# Patient Record
Sex: Female | Born: 1960 | Race: White | Hispanic: No | Marital: Married | Smoking: Never smoker
Health system: Southern US, Community
[De-identification: ages and names within clinical notes are randomized; demographics above are authoritative.]

## PROBLEM LIST (undated history)

## (undated) DIAGNOSIS — E079 Disorder of thyroid, unspecified: Secondary | ICD-10-CM

## (undated) DIAGNOSIS — G510 Bell's palsy: Secondary | ICD-10-CM

## (undated) DIAGNOSIS — R569 Unspecified convulsions: Secondary | ICD-10-CM

## (undated) DIAGNOSIS — I10 Essential (primary) hypertension: Secondary | ICD-10-CM

## (undated) DIAGNOSIS — G43909 Migraine, unspecified, not intractable, without status migrainosus: Secondary | ICD-10-CM

## (undated) DIAGNOSIS — F419 Anxiety disorder, unspecified: Secondary | ICD-10-CM

## (undated) DIAGNOSIS — H539 Unspecified visual disturbance: Secondary | ICD-10-CM

## (undated) DIAGNOSIS — G35 Multiple sclerosis: Secondary | ICD-10-CM

## (undated) HISTORY — PX: TUBAL LIGATION: SHX77

## (undated) HISTORY — DX: Essential (primary) hypertension: I10

## (undated) HISTORY — PX: ENDOMETRIAL ABLATION: SHX621

## (undated) HISTORY — DX: Unspecified convulsions: R56.9

## (undated) HISTORY — DX: Unspecified visual disturbance: H53.9

---

## 2008-01-09 ENCOUNTER — Emergency Department (HOSPITAL_BASED_OUTPATIENT_CLINIC_OR_DEPARTMENT_OTHER): Admission: EM | Admit: 2008-01-09 | Discharge: 2008-01-09 | Payer: Self-pay | Admitting: Emergency Medicine

## 2008-12-05 IMAGING — CT CT HEAD W/O CM
1 series · 16 of 30 positions shown, 20 images · non-contrast
Comparison: None

CLINICAL DATA: Sudden onset left sided facial numbness, drooping,
unable to speak, difficulty eating.  History migraine headaches.

CT HEAD WITHOUT CONTRAST
TECHNIQUE: Contiguous axial images were obtained from the base of
the skull through the vertex without contrast.

[Series 2: head 4.8 h37s · axial · 0.44mm/px · z∈[+1352,+1492]mm · 16 of 32 slices shown, 20 images]
[im 2/32  brain]
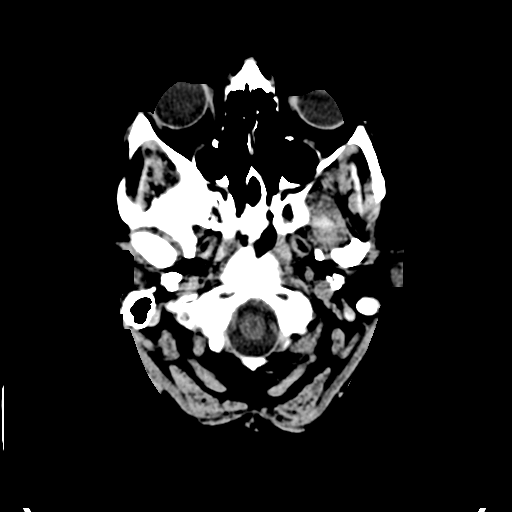
[im 2/32  bone]
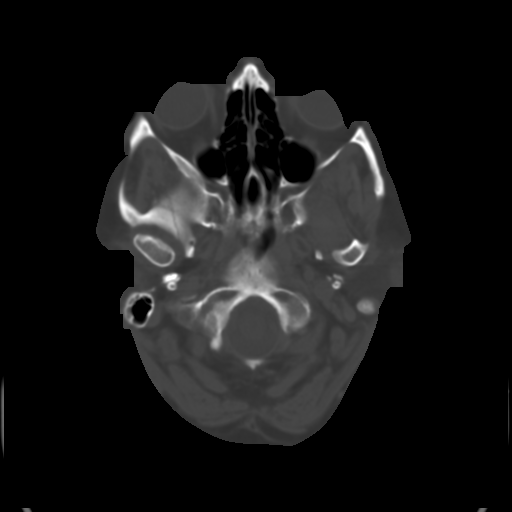
[im 4/32  brain]
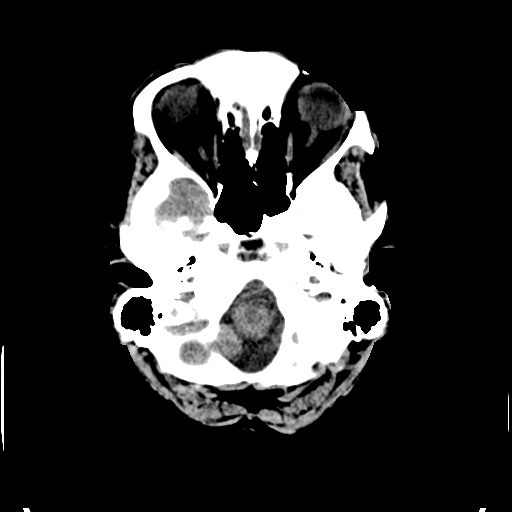
[im 6/32  brain]
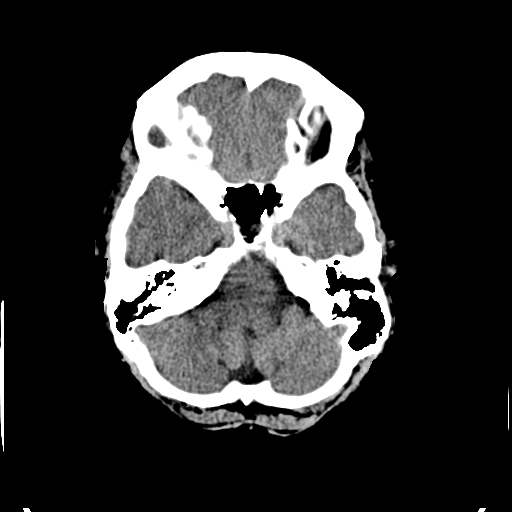
[im 8/32  brain]
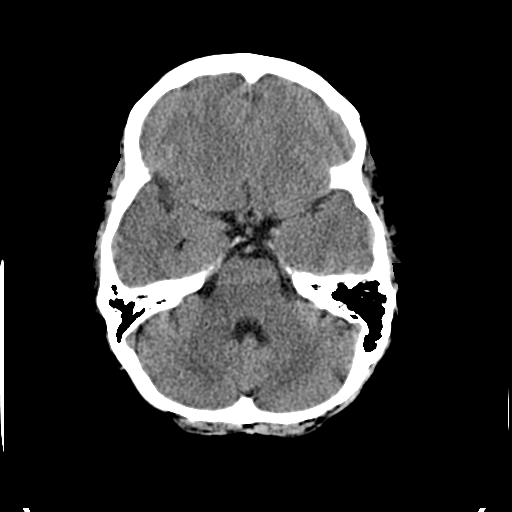
[im 9/32  brain]
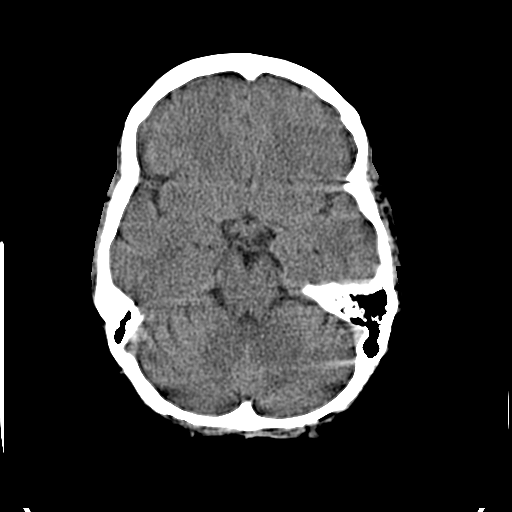
[im 9/32  bone]
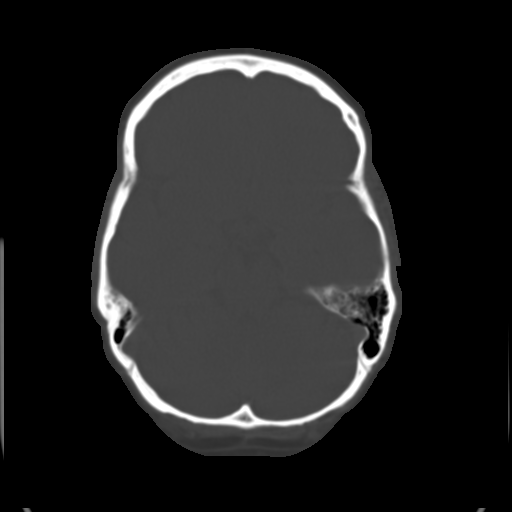
[im 11/32  brain]
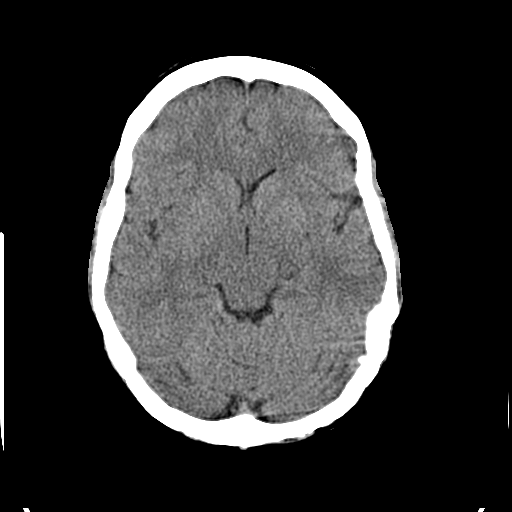
[im 13/32  brain]
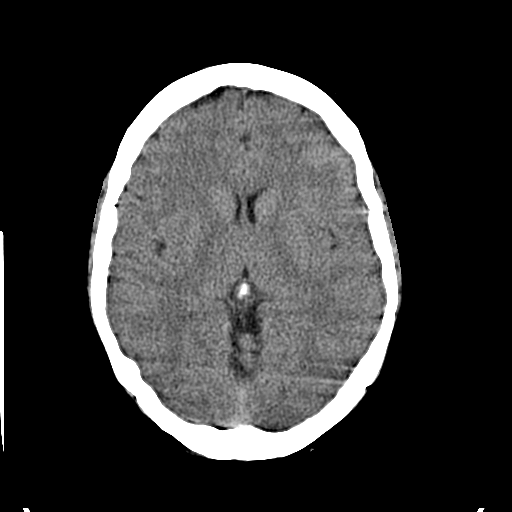
[im 15/32  brain]
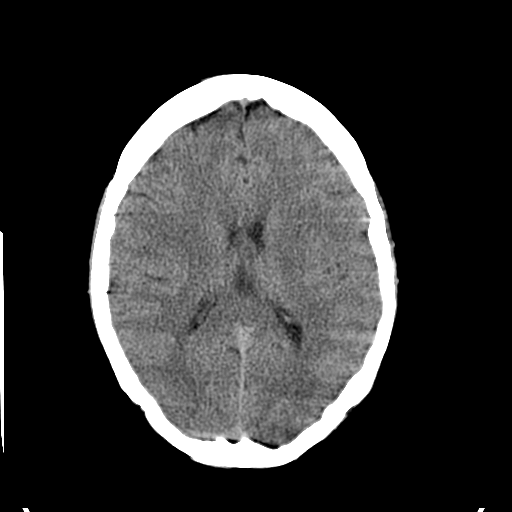
[im 17/32  brain]
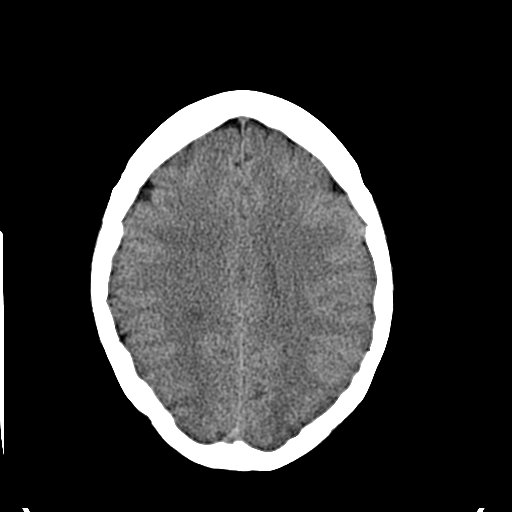
[im 17/32  bone]
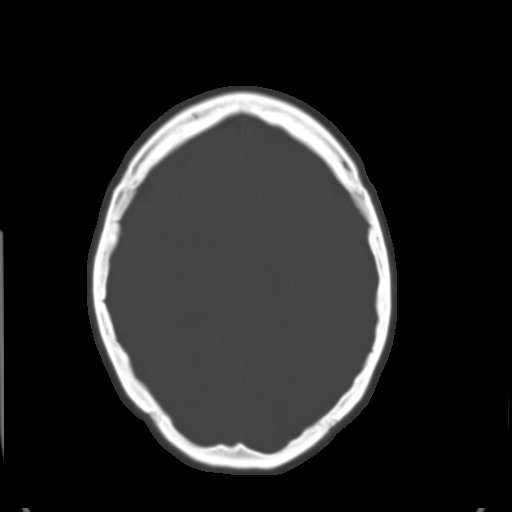
[im 19/32  brain]
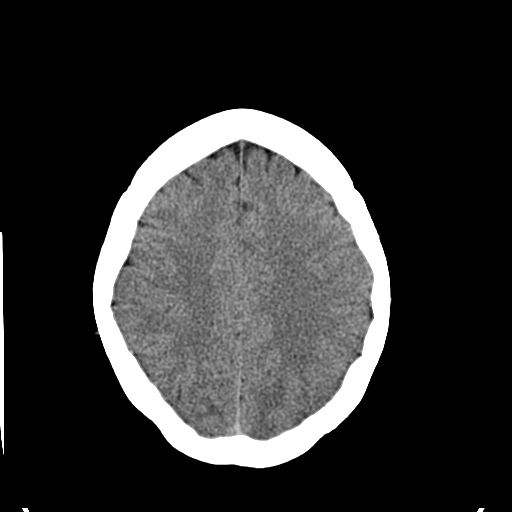
[im 21/32  brain]
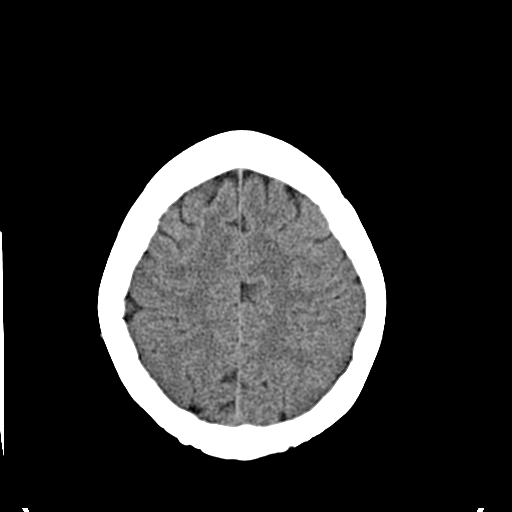
[im 23/32  brain]
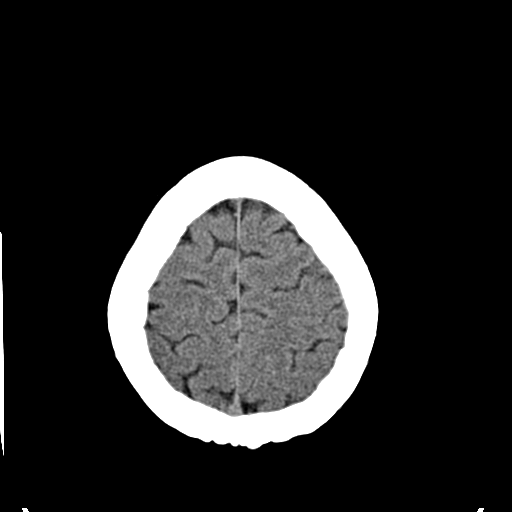
[im 24/32  brain]
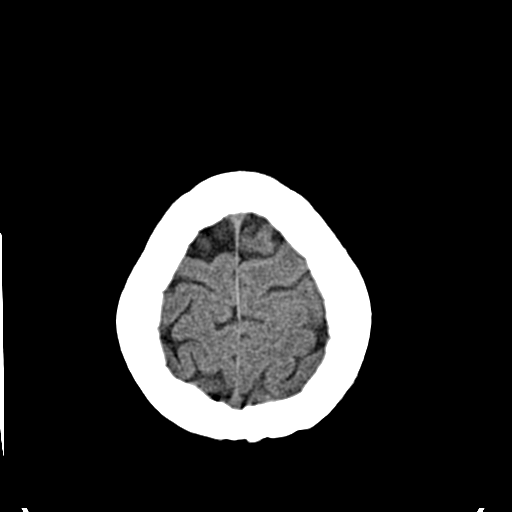
[im 24/32  bone]
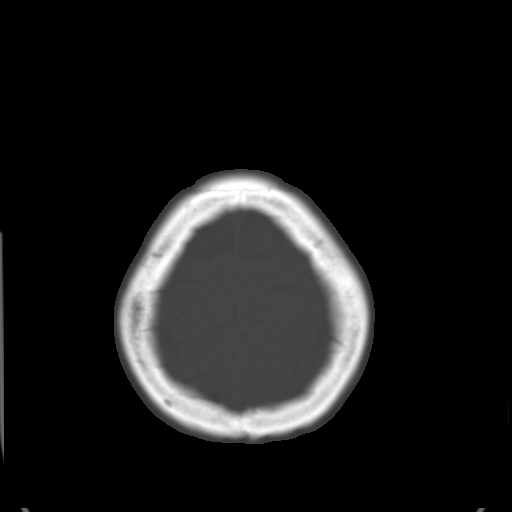
[im 26/32  brain]
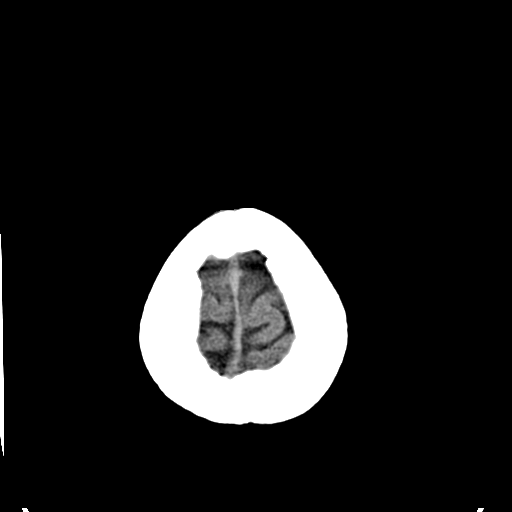
[im 28/32  brain]
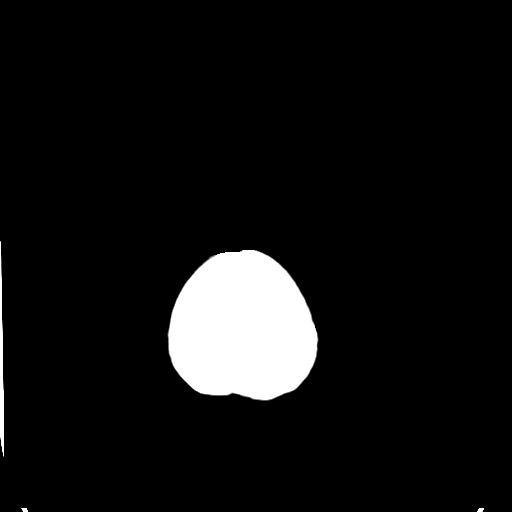
[im 30/32  brain]
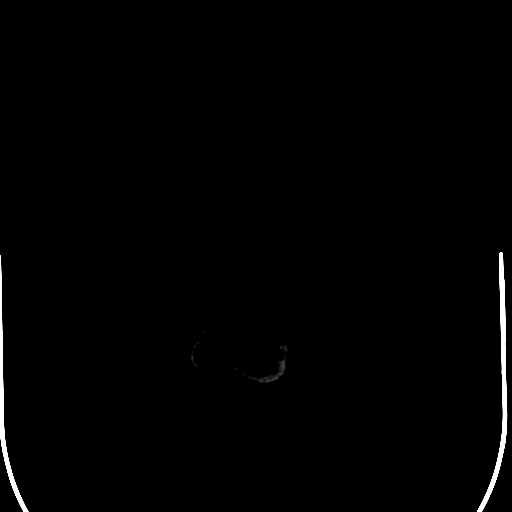

[16 of 30 positions shown; findings below may reference images not displayed]

FINDINGS: Cerebrum, cerebral ventricles, brainstem, and cerebellum
appear normal.  Specifically no focal intracerebral acute
hemorrhage, edema, tumor, mass effect/midline shift or extra-axial
hemorrhage/collection is seen.  Paranasal sinuses and bilateral
mastoid air cells are clear.
IMPRESSION: Normal.

## 2012-10-08 DIAGNOSIS — E669 Obesity, unspecified: Secondary | ICD-10-CM | POA: Insufficient documentation

## 2012-10-08 DIAGNOSIS — G35 Multiple sclerosis: Secondary | ICD-10-CM | POA: Insufficient documentation

## 2014-10-24 ENCOUNTER — Encounter: Payer: Self-pay | Admitting: Emergency Medicine

## 2014-10-24 ENCOUNTER — Emergency Department (INDEPENDENT_AMBULATORY_CARE_PROVIDER_SITE_OTHER)
Admission: EM | Admit: 2014-10-24 | Discharge: 2014-10-24 | Disposition: A | Discharge status: Home / Self Care | Payer: BLUE CROSS/BLUE SHIELD | Source: Home / Self Care | Attending: Emergency Medicine | Admitting: Emergency Medicine

## 2014-10-24 ENCOUNTER — Emergency Department (INDEPENDENT_AMBULATORY_CARE_PROVIDER_SITE_OTHER): Payer: BLUE CROSS/BLUE SHIELD

## 2014-10-24 DIAGNOSIS — R1011 Right upper quadrant pain: Secondary | ICD-10-CM

## 2014-10-24 DIAGNOSIS — K5900 Constipation, unspecified: Secondary | ICD-10-CM

## 2014-10-24 DIAGNOSIS — R000 Tachycardia, unspecified: Secondary | ICD-10-CM

## 2014-10-24 HISTORY — DX: Bell's palsy: G51.0

## 2014-10-24 HISTORY — DX: Anxiety disorder, unspecified: F41.9

## 2014-10-24 HISTORY — DX: Migraine, unspecified, not intractable, without status migrainosus: G43.909

## 2014-10-24 HISTORY — DX: Disorder of thyroid, unspecified: E07.9

## 2014-10-24 HISTORY — DX: Multiple sclerosis: G35

## 2014-10-24 LAB — POCT URINALYSIS DIP (MANUAL ENTRY)
Bilirubin, UA: NEGATIVE
GLUCOSE UA: NEGATIVE
Ketones, POC UA: NEGATIVE
LEUKOCYTES UA: NEGATIVE
Nitrite, UA: NEGATIVE
PH UA: 6 (ref 5–8)
PROTEIN UA: NEGATIVE
Spec Grav, UA: <=1.005 (ref 1.005–1.03)
Urobilinogen, UA: 0.2 (ref 0–1)

## 2014-10-24 LAB — POCT FASTING CBG KUC MANUAL ENTRY: POCT GLUCOSE (MANUAL ENTRY) KUC: 124 mg/dL — AB (ref 70–99)

## 2014-10-24 MED ORDER — POLYETHYLENE GLYCOL 3350 17 G PO PACK: 17.0000 g | PACK | Freq: Every day | ORAL | Status: DC

## 2014-10-24 NOTE — ED Provider Notes (Signed)
CSN: [id number]     Arrival date & time 10/24/14  1101 History   First MD Initiated Contact with Patient 10/24/14 1138     Chief Complaint  Patient presents with  . Urinary Frequency   (Consider location/radiation/quality/duration/timing/severity/associated sxs/prior Treatment) Patient is a 54 y.o. female presenting with frequency. The history is provided by the patient. No language interpreter was used.  Urinary Frequency This is a new problem. The current episode started more than 2 days ago. The problem occurs constantly. The problem has been resolved. Pertinent negatives include no shortness of breath. Nothing aggravates the symptoms. Nothing relieves the symptoms. She has tried nothing for the symptoms.  Pt reports urinary frequency and constipation. Patient complains of some nausea today  She has been experiencing palpitations. Patient reports she thought constipation might be due to her Tapazole which she takes for her hyperthyroidism. Patient reports she cut her medicine back to half her normal dosage for the past 3 days. Patient reports she noticed her heart racing since she stopped the medication. She has tried several laxatives  with no relief. Patient has a history of MS. She is currently followed by an endocrinologist for thyroid and a neurologist for her EMS she does not currently have a primary care physician.  Patient began feeling nauseated and lightheaded on arrival to exam room. Patient's blood pressure remained stable however her heart rate dropped to the 50s. Patient reports she did not eat or drink anything yesterday because  she has not been feeling well.  Past Medical History  Diagnosis Date  . MS (multiple sclerosis)   . Thyroid disease   . Anxiety   . Migraine   . Bell's palsy    Past Surgical History  Procedure Laterality Date  . Tubal ligation    . Endometrial ablation     History reviewed. No pertinent family history. History  Substance Use Topics  . Smoking  status: Never Smoker   . Smokeless tobacco: Not on file  . Alcohol Use: No   OB History    No data available     Review of Systems  Respiratory: Negative for shortness of breath.   Genitourinary: Positive for frequency.  All other systems reviewed and are negative.   Allergies  Benadryl  Home Medications   Prior to Admission medications   Medication Sig Start Date End Date Taking? Authorizing Provider  methimazole (TAPAZOLE) 10 MG tablet Take 20 mg by mouth 2 (two) times daily.   Yes Historical Provider, MD  natalizumab (TYSABRI) 300 MG/15ML injection Inject into the vein.   Yes Historical Provider, MD  ranitidine (ZANTAC) 15 MG/ML syrup Take by mouth 2 (two) times daily.   Yes Historical Provider, MD  topiramate (TOPAMAX) 50 MG tablet Take 50 mg by mouth 2 (two) times daily.   Yes Historical Provider, MD   BP 113/80 mmHg  Pulse 136  Resp 18  Ht 5\' 4"  (1.626 m)  Wt 235 lb (106.595 kg)  BMI 40.32 kg/m2  SpO2 99% Physical Exam  Constitutional: She is oriented to person, place, and time. She appears well-developed and well-nourished.  HENT:  Head: Normocephalic and atraumatic.  Right Ear: External ear normal.  Left Ear: External ear normal.  Mouth/Throat: Oropharynx is clear and moist.  Eyes: Conjunctivae and EOM are normal. Pupils are equal, round, and reactive to light.  Neck: Normal range of motion.  Cardiovascular: Regular rhythm, normal heart sounds and intact distal pulses.   Tachycardia  Pulmonary/Chest: Effort normal.  Abdominal: Soft.  She exhibits no distension.  Musculoskeletal: Normal range of motion.  Neurological: She is alert and oriented to person, place, and time.  Skin: Skin is warm.  Psychiatric: She has a normal mood and affect.  Nursing note and vitals reviewed.   ED Course  Procedures (including critical care time) Labs Review Labs Reviewed  URINE CULTURE  POCT URINALYSIS DIP (MANUAL ENTRY)   urine showed small amount of blood otherwise  negative acute abdominal series was read by radiologist as negative. Patient does have constipation noted by me. EKG shows a sinus tachycardia at 127  no ST changes, normal QRS QT is normal no STEMI.  Imaging Review Dg Abd 1 View  10/24/2014   CLINICAL DATA:  Right upper quadrant pain and nausea.  EXAM: ABDOMEN - 1 VIEW  COMPARISON:  None.  FINDINGS: No dilated loops of large or small bowel. Gas and stool in the rectum. No pathologic calcifications. No organomegaly. No acute osseous abnormality  IMPRESSION: No bowel obstruction.   Electronically Signed   By: Suzy Bouchard M.D.   On: 10/24/2014 13:55     MDM patient was given Sprite and crackers her blood glucose was normal. Patient felt much improved after fluids and eating she is still tachycardic but feels much better I discussed with her her symptoms her abdominal exam is benign.  I suspect her tachycardia is secondary to her not taking her medication for her hyperthyroid. Patient is advised to take her regular dosage as prescribed by her endocrinologist she is given a prescription for Mira lax for constipation. She was advised if she has any further symptoms are worsening of symptoms he needs to go to the emergency department for evaluation. Patient is given the number for family practice .  I spoke with her husband about observing the patient carefully.    1. Constipation, unspecified constipation type    avs  miralax    Fransico Meadow, PA-C 10/24/14 1458

## 2014-10-24 NOTE — Discharge Instructions (Signed)
Constipation °Constipation is when a person has fewer than three bowel movements a week, has difficulty having a bowel movement, or has stools that are dry, hard, or larger than normal. As people grow older, constipation is more common. If you try to fix constipation with medicines that make you have a bowel movement (laxatives), the problem may get worse. Long-term laxative use may cause the muscles of the colon to become weak. A low-fiber diet, not taking in enough fluids, and taking certain medicines may make constipation worse.  °CAUSES  °· Certain medicines, such as antidepressants, pain medicine, iron supplements, antacids, and water pills.   °· Certain diseases, such as diabetes, irritable bowel syndrome (IBS), thyroid disease, or depression.   °· Not drinking enough water.   °· Not eating enough fiber-rich foods.   °· Stress or travel.   °· Lack of physical activity or exercise.   °· Ignoring the urge to have a bowel movement.   °· Using laxatives too much.   °SIGNS AND SYMPTOMS  °· Having fewer than three bowel movements a week.   °· Straining to have a bowel movement.   °· Having stools that are hard, dry, or larger than normal.   °· Feeling full or bloated.   °· Pain in the lower abdomen.   °· Not feeling relief after having a bowel movement.   °DIAGNOSIS  °Your health care provider will take a medical history and perform a physical exam. Further testing may be done for severe constipation. Some tests may include: °· A barium enema X-ray to examine your rectum, colon, and, sometimes, your small intestine.   °· A sigmoidoscopy to examine your lower colon.   °· A colonoscopy to examine your entire colon. °TREATMENT  °Treatment will depend on the severity of your constipation and what is causing it. Some dietary treatments include drinking more fluids and eating more fiber-rich foods. Lifestyle treatments may include regular exercise. If these diet and lifestyle recommendations do not help, your health care  provider may recommend taking over-the-counter laxative medicines to help you have bowel movements. Prescription medicines may be prescribed if over-the-counter medicines do not work.  °HOME CARE INSTRUCTIONS  °· Eat foods that have a lot of fiber, such as fruits, vegetables, whole grains, and beans. °· Limit foods high in fat and processed sugars, such as french fries, hamburgers, cookies, candies, and soda.   °· A fiber supplement may be added to your diet if you cannot get enough fiber from foods.   °· Drink enough fluids to keep your urine clear or pale yellow.   °· Exercise regularly or as directed by your health care provider.   °· Go to the restroom when you have the urge to go. Do not hold it.   °· Only take over-the-counter or prescription medicines as directed by your health care provider. Do not take other medicines for constipation without talking to your health care provider first.   °SEEK IMMEDIATE MEDICAL CARE IF:  °· You have bright red blood in your stool.   °· Your constipation lasts for more than 4 days or gets worse.   °· You have abdominal or rectal pain.   °· You have thin, pencil-like stools.   °· You have unexplained weight loss. °MAKE SURE YOU:  °· Understand these instructions. °· Will watch your condition. °· Will get help right away if you are not doing well or get worse. °Document Released: 02/03/2004 Document Revised: 05/12/2013 Document Reviewed: 02/16/2013 °[employer]® Patient Information ©2015 [employer]. This information is not intended to replace advice given to you by your health care provider. Make sure you discuss any questions   you have with your health care provider. Nonspecific Tachycardia Tachycardia is a faster than normal heartbeat (more than 100 beats per minute). In adults, the heart normally beats between 60 and 100 times a minute. A fast heartbeat may be a normal response to exercise or stress. It does not necessarily mean that something is wrong. However, sometimes  when your heart beats too fast it may not be able to pump enough blood to the rest of your body. This can result in chest pain, shortness of breath, dizziness, and even fainting. Nonspecific tachycardia means that the specific cause or pattern of your tachycardia is unknown. CAUSES  Tachycardia may be harmless or it may be due to a more serious underlying cause. Possible causes of tachycardia include:  Exercise or exertion.  Fever.  Pain or injury.  Infection.  Loss of body fluids (dehydration).  Overactive thyroid.  Lack of red blood cells (anemia).  Anxiety and stress.  Alcohol.  Caffeine.  Tobacco products.  Diet pills.  Illegal drugs.  Heart disease. SYMPTOMS  Rapid or irregular heartbeat (palpitations).  Suddenly feeling your heart beating (cardiac awareness).  Dizziness.  Tiredness (fatigue).  Shortness of breath.  Chest pain.  Nausea.  Fainting. DIAGNOSIS  Your caregiver will perform a physical exam and take your medical history. In some cases, a heart specialist (cardiologist) may be consulted. Your caregiver may also order:  Blood tests.  Electrocardiography. This test records the electrical activity of your heart.  A heart monitoring test. TREATMENT  Treatment will depend on the likely cause of your tachycardia. The goal is to treat the underlying cause of your tachycardia. Treatment methods may include:  Replacement of fluids or blood through an intravenous (IV) tube for moderate to severe dehydration or anemia.  New medicines or changes in your current medicines.  Diet and lifestyle changes.  Treatment for certain infections.  Stress relief or relaxation methods. HOME CARE INSTRUCTIONS   Rest.  Drink enough fluids to keep your urine clear or pale yellow.  Do not smoke.  Avoid:  Caffeine.  Tobacco.  Alcohol.  Chocolate.  Stimulants such as over-the-counter diet pills or pills that help you stay awake.  Situations that  cause anxiety or stress.  Illegal drugs such as marijuana, phencyclidine (PCP), and cocaine.  Only take medicine as directed by your caregiver.  Keep all follow-up appointments as directed by your caregiver. SEEK IMMEDIATE MEDICAL CARE IF:   You have pain in your chest, upper arms, jaw, or neck.  You become weak, dizzy, or feel faint.  You have palpitations that will not go away.  You vomit, have diarrhea, or pass blood in your stool.  Your skin is cool, pale, and wet.  You have a fever that will not go away with rest, fluids, and medicine. MAKE SURE YOU:   Understand these instructions.  Will watch your condition.  Will get help right away if you are not doing well or get worse. Document Released: 06/14/2004 Document Revised: 07/30/2011 Document Reviewed: 04/17/2011 [facility] Patient Information 2015 [place], [place]. This information is not intended to replace advice given to you by your health care provider. Make sure you discuss any questions you have with your health care provider.

## 2014-10-24 NOTE — ED Notes (Signed)
Patient has had urinary frequency x 3 days; also some pelvic area cramping. She has been constipated and has been intermittently nauseated. She has MS and her recent symptoms are confusing to her.

## 2014-10-25 LAB — URINE CULTURE: Colony Count: 15000

## 2014-10-26 ENCOUNTER — Telehealth: Payer: Self-pay | Admitting: *Deleted

## 2015-02-14 DIAGNOSIS — F411 Generalized anxiety disorder: Secondary | ICD-10-CM | POA: Insufficient documentation

## 2015-02-14 DIAGNOSIS — R5382 Chronic fatigue, unspecified: Secondary | ICD-10-CM | POA: Insufficient documentation

## 2015-09-20 IMAGING — CR DG ABDOMEN 1V
3 series · 3 of 3 positions shown · non-contrast
Comparison: None.

CLINICAL DATA: Right upper quadrant pain and nausea.

EXAM:
ABDOMEN - 1 VIEW

[abdomen kub (1 of 3)]
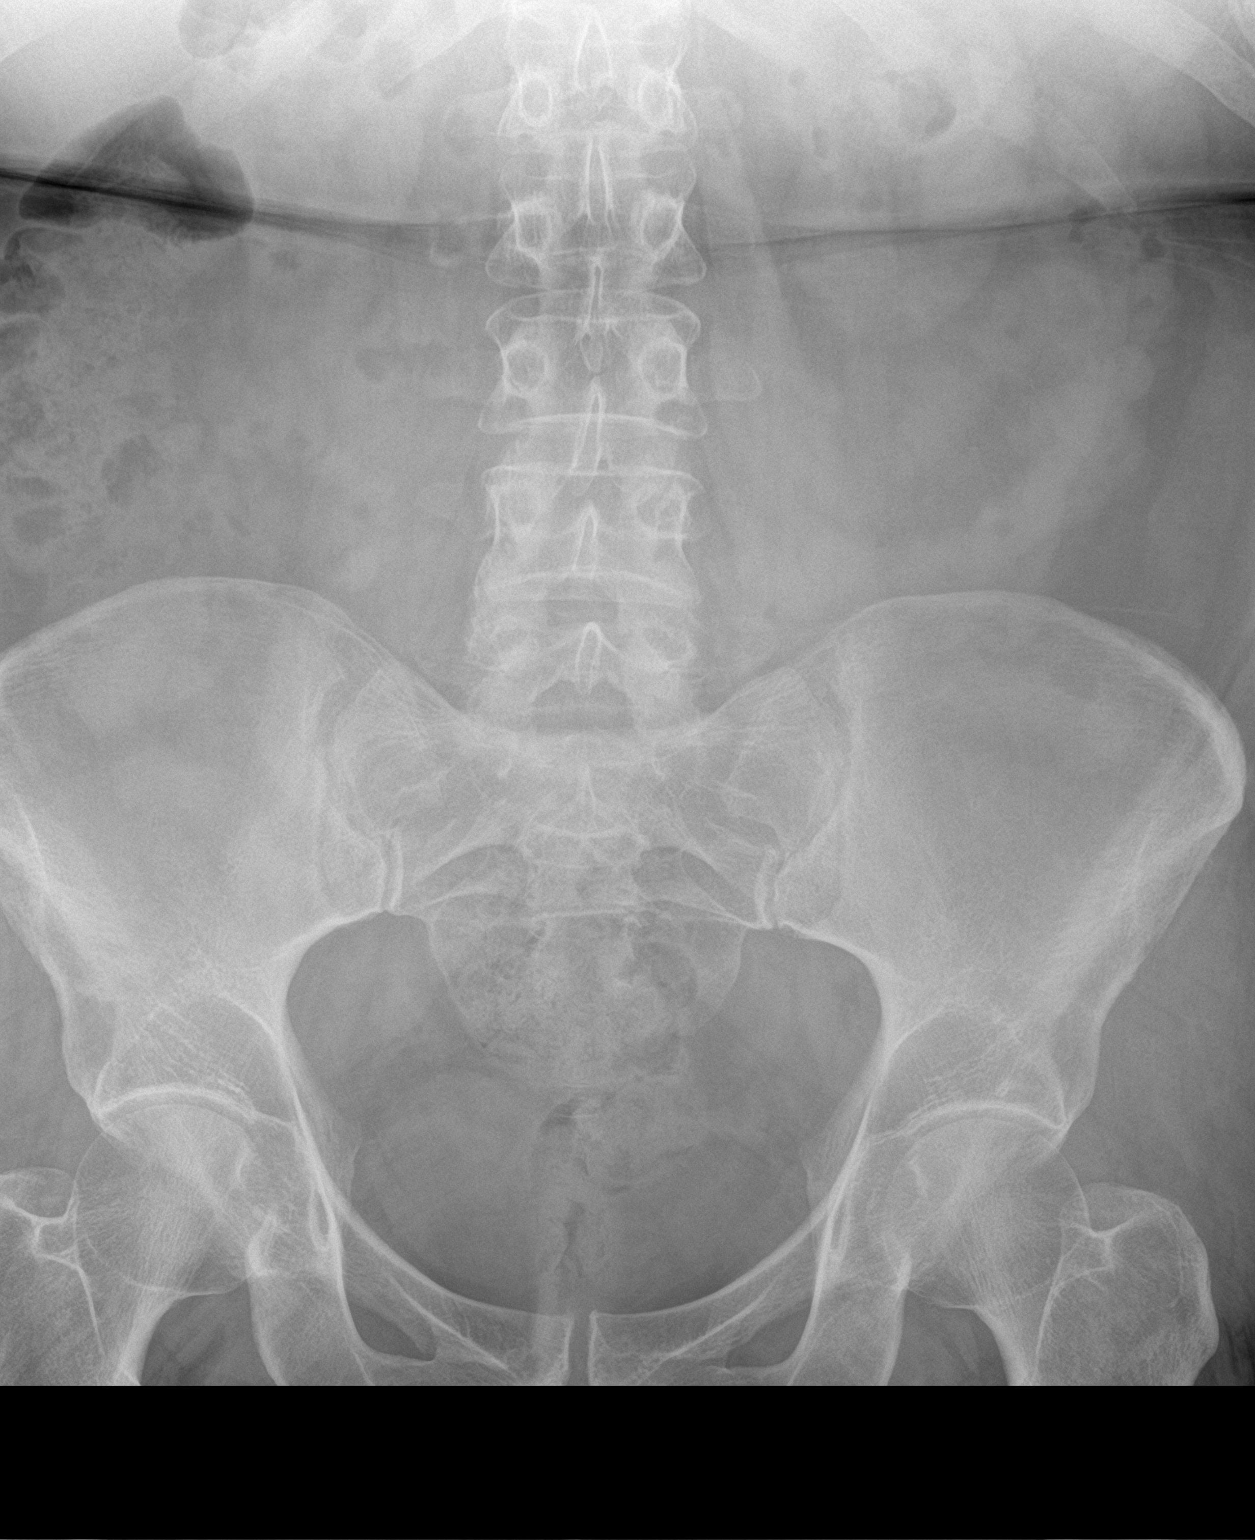

[abdomen kub (2 of 3)]
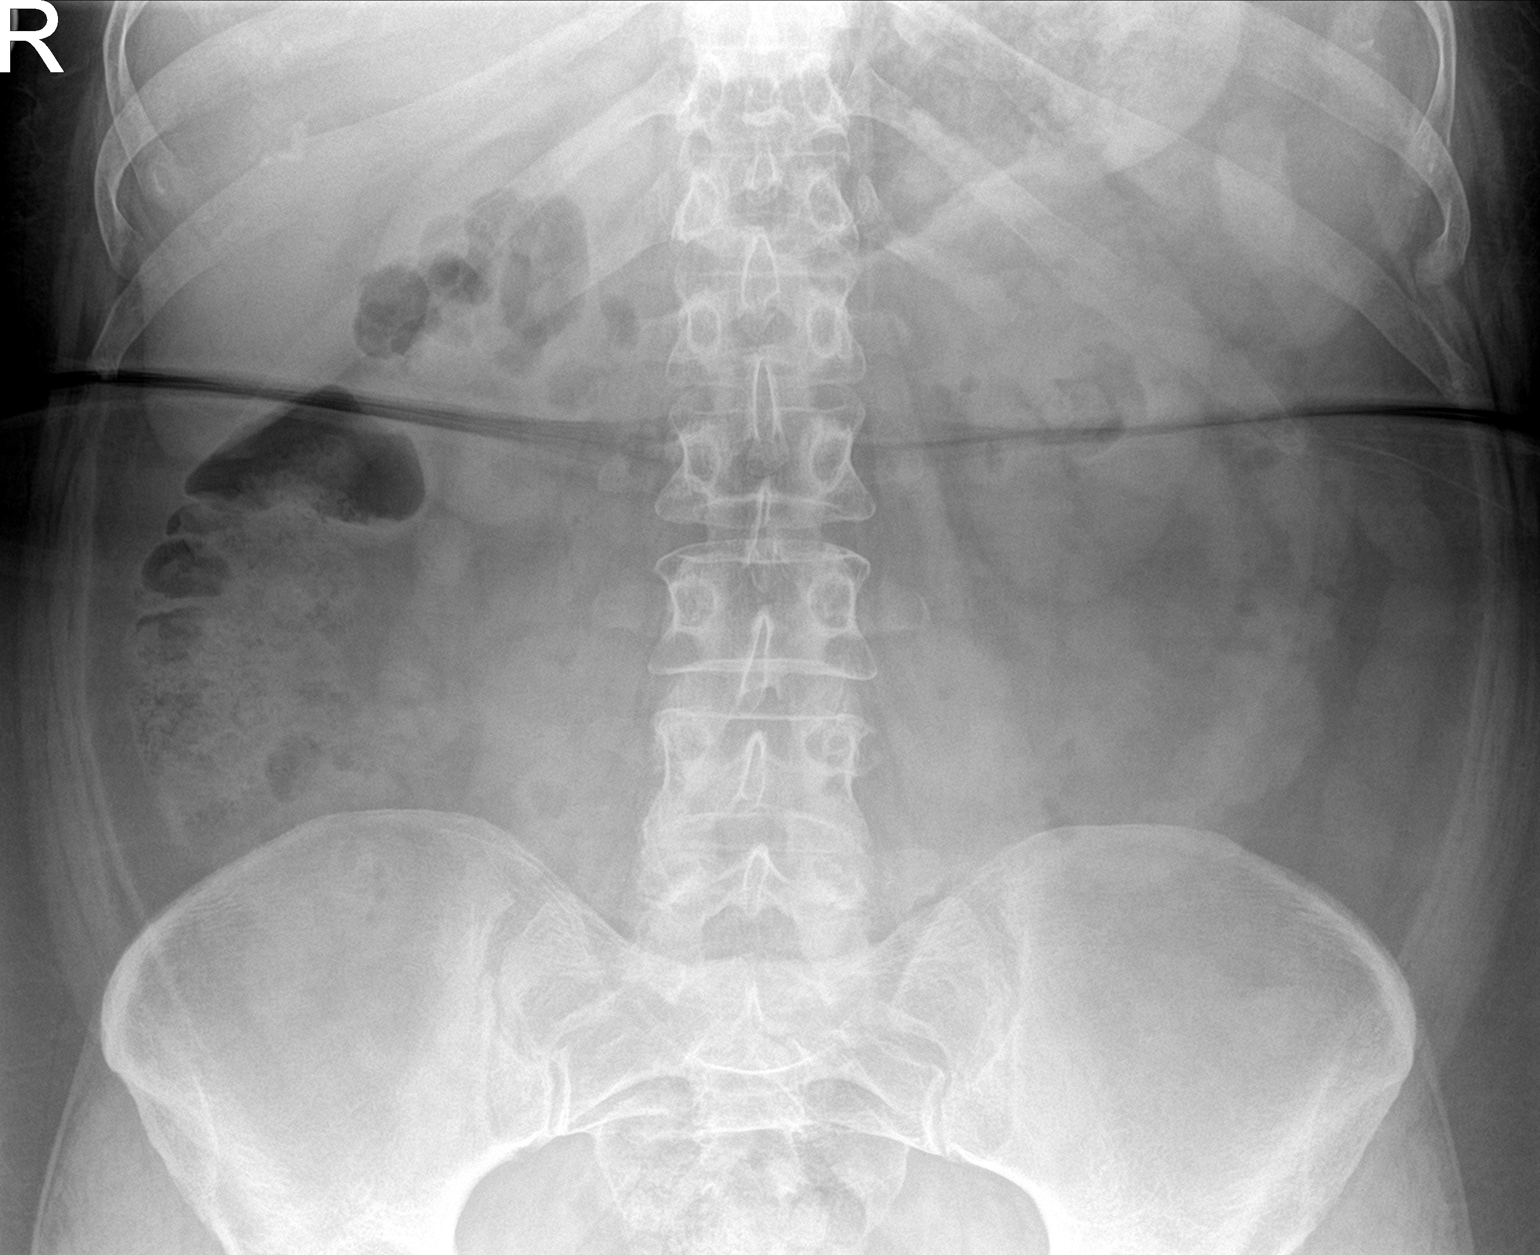

[abdomen kub (3 of 3)]
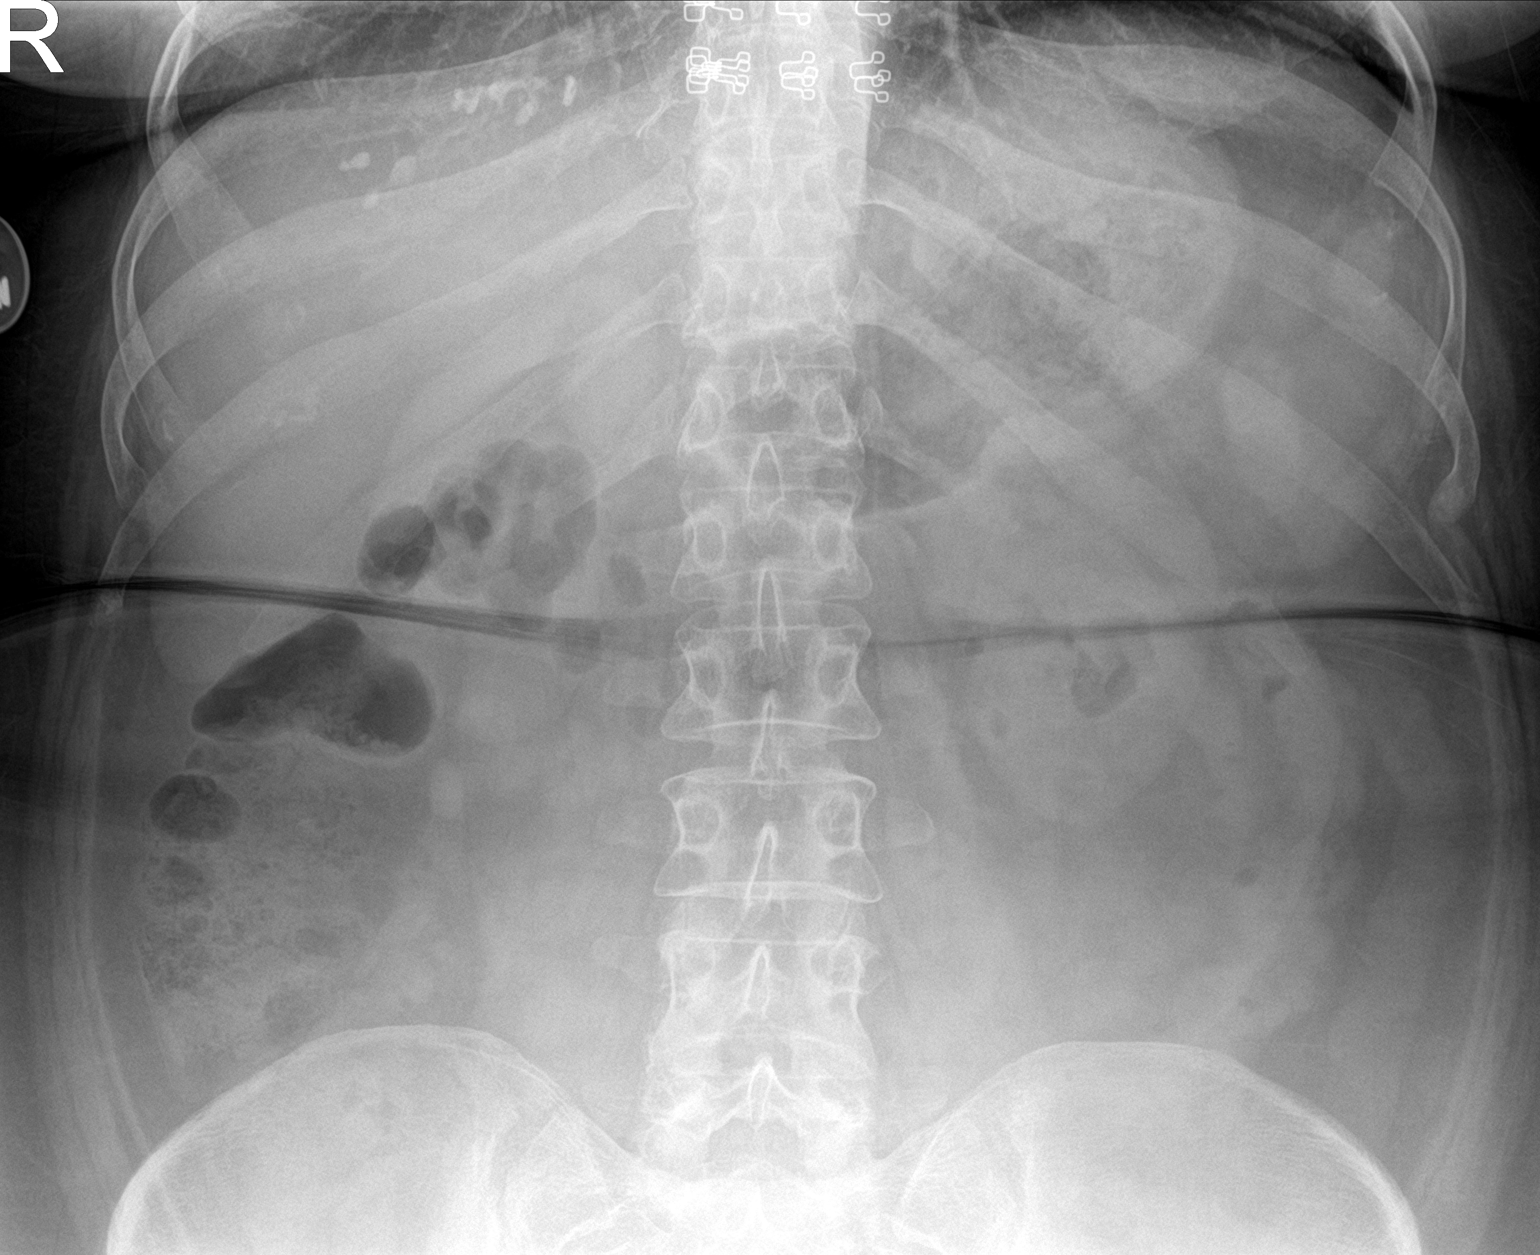

[3 of 3 positions shown; findings below may reference images not displayed]

FINDINGS: No dilated loops of large or small bowel. Gas and stool in the
rectum. No pathologic calcifications. No organomegaly. No acute
osseous abnormality
IMPRESSION: No bowel obstruction.

## 2016-06-14 ENCOUNTER — Telehealth: Payer: Self-pay | Admitting: *Deleted

## 2016-06-14 NOTE — Telephone Encounter (Signed)
Patient called to see if we have received records from Dr. Kendrick Ranch office [phone]).  He has retired and she needs a new patient appointment to establish care with Dr. Felecia Shelling.  She will need this appt sometime in the next month so she does not run out of her MS medication (Gilenya).  States she requested Dr. Kendrick Ranch office to send records on 05/10/16 and again last week.  She would like a phone call back, at our earliest convenience, to provide her with an appt time.  I have updated her contact information in Epic.

## 2016-06-14 NOTE — Telephone Encounter (Signed)
See other phone note

## 2016-06-14 NOTE — Telephone Encounter (Signed)
I called patient to let her know we did not receive notes and to have them faxed to [phone] dg

## 2016-08-08 ENCOUNTER — Telehealth: Payer: Self-pay | Admitting: Neurology

## 2016-08-08 ENCOUNTER — Ambulatory Visit (INDEPENDENT_AMBULATORY_CARE_PROVIDER_SITE_OTHER): Payer: PRIVATE HEALTH INSURANCE | Admitting: Neurology

## 2016-08-08 ENCOUNTER — Encounter: Payer: Self-pay | Admitting: Neurology

## 2016-08-08 ENCOUNTER — Encounter: Payer: Self-pay | Admitting: *Deleted

## 2016-08-08 VITALS — BP 114/80 | HR 68 | Resp 18 | Ht 64.0 in | Wt 242.5 lb

## 2016-08-08 DIAGNOSIS — R5382 Chronic fatigue, unspecified: Secondary | ICD-10-CM | POA: Diagnosis not present

## 2016-08-08 DIAGNOSIS — R0683 Snoring: Secondary | ICD-10-CM

## 2016-08-08 DIAGNOSIS — F411 Generalized anxiety disorder: Secondary | ICD-10-CM | POA: Diagnosis not present

## 2016-08-08 DIAGNOSIS — G4733 Obstructive sleep apnea (adult) (pediatric): Secondary | ICD-10-CM

## 2016-08-08 DIAGNOSIS — Z79899 Other long term (current) drug therapy: Secondary | ICD-10-CM | POA: Diagnosis not present

## 2016-08-08 DIAGNOSIS — G43009 Migraine without aura, not intractable, without status migrainosus: Secondary | ICD-10-CM | POA: Diagnosis not present

## 2016-08-08 DIAGNOSIS — G35 Multiple sclerosis: Secondary | ICD-10-CM | POA: Diagnosis not present

## 2016-08-08 DIAGNOSIS — G43909 Migraine, unspecified, not intractable, without status migrainosus: Secondary | ICD-10-CM | POA: Insufficient documentation

## 2016-08-08 MED ORDER — TOPIRAMATE 25 MG PO CPSP: 25.0000 mg | ORAL_CAPSULE | Freq: Three times a day (TID) | ORAL | 3 refills | Status: DC

## 2016-08-08 NOTE — Telephone Encounter (Signed)
I have spoken with [facility] and clarified Topamax rx.--directions are 1 capsule in the morning and 2 capsules at bedtime, for migraine prevention/fim

## 2016-08-08 NOTE — Progress Notes (Signed)
[facility]  PATIENT: Shelly Boyd DOB: 1961/01/30  REFERRING DOCTOR OR PCP:  Dr. Maye Hides, PCP is [name] SOURCE: patient, notes rom Dr. Starleen Blue, lab/imaging results, MRI images on CD/PACS  _________________________________   HISTORICAL  CHIEF COMPLAINT:  Chief Complaint  Patient presents with  . Multiple Sclerosis    Shelly Boyd is here with her husband [name] to transfer care of her MS to Dr. Felecia Shelling.   Sts. she was dx. in 2012.  Presenting sx. was pain/numbness right arm and blurry vision.  Saw her opthalmologist and was dx. with nystagmus.  She saw Dr. Starleen Blue and MS dx. confirmed with MRI.  No LP.  She initially tried Betaseron but has an allergic rash with this.  Switched to Tysabri but stopped 42 mos. later due to high JCV ab titer.  Started Gileny in Feb. 2017 and sts. she has done well on this; would like to continue  . Fatigue    this. Sts. fatigue is her most bothersome sx.  Some intermittent numbness in right arm if she gets too hot or fatigued.  Last MRI brain done December 2017 at [facility].  She has cd with her/fim  . Migraines    Sts. migraines are well controlled with Topamax 25mg , one in the am and 2 at bedtime. [name]  . Seizures    Hx. of 2 sz. related to taking Benadryl.  Last sx. 22 yrs. ago./fim    HISTORY OF PRESENT ILLNESS:  I had the pleasure seeing you patient, Shelly Boyd, [facility] for neurologic consultation regarding her multiple sclerosis..  She is a 56 year old woman who was diagnosed in 2012 after presenting with pain and numbness in the right arm and blurry vision. Her ophthalmologist diagnosed her with nystagmus. She was referred to Dr. Starleen Blue in advance and was diagnosed with MS after an MRI. Combination of her symptoms and classic MRI were sufficient to diagnose MS without the need to undergo a lumbar puncture. She received 3 days of IV Solu-Medrol and was back to baseline in 3 weeks..  Initially, she  was placed on Betaseron. She had a rash with that and was switched to Tysabri. After 42 infusions, she switched due to a high JCV antibody titer (0.66).  Since February 2017 she has been on Gilenya. She tolerates it well and has not had any exacerbations.    Her last MRI of the brain was performed December 2017. Personally reviewed the images. She she has multiple T2/FLAIR hyperintense foci, predominantly in the periventricular white matter. Most are radially oriented to the ventricles and some are hypointense on T1-weighted images. None of the foci enhanced. There were no acute findings.    Gait/strength/sensation: She feels that her gait is good and she does not report difficulties with her balance. She does not note any weakness in general. However, when she gets hot or tired the right leg will be slightly weak and also sometimes she will have more spasticity in that leg. She does sometimes note some tingling or pain in the right arm and leg, usually when she is either hot or tired.  Vision: She denies any current issue with her visual acuity. There is no double vision. There is no nystagmus. There is no eye pain.  Bladder/bowel: She denies any difficulty with urinary frequency, urgency or hesitancy. She does have 2 times nocturia. There is no incontinence. She denies any bowel issues.   Fatigue/sleep:      She notes fatigue that is more  physical and cognitive. Fatigue is worse when she is hot. She sleeps 6-6-1/2 hours a night but does not always feel refreshed when she wakes up. She will usually wake up twice a night to use the bathroom but will fall back asleep. She has been noted to snore. There is some irregularity in her breathing and her husband describes possible sleep apnea.  Mood/cognition: She denies any depression but does note some anxiety. She has some stress taking care of her mother who is in assisted living. She denies any significant issues with cognitive function. Specifically, there  is no problems with memory, verbal fluency or executive function.   EPWORTH SLEEPINESS SCALE  On a scale of 0 - 3 what is the chance of dozing:  Sitting and Reading:   2 Watching TV:    3 Sitting inactive in a public place: 0 Passenger in car for one hour: 0 Lying down to rest in the afternoon: 3 Sitting and talking to someone: 0 Sitting quietly after lunch:  0 In a car, stopped in traffic:  0  Total (out of 24):      8/24    Migraine headaches. She has common migraine headaches with throbbing pain that occurs a few times a month. She has done much better on Topamax 25 mg in the morning and 50 and night.    She takes a Migranal if a a migraine occurs.   REVIEW OF SYSTEMS: Constitutional: No fevers, chills, sweats, or change in appetite.   She reports fatigue.   Has gained a few pounds over the past year.   Eyes: No visual changes, double vision, eye pain Ear, nose and throat: No hearing loss, ear pain, nasal congestion, sore throat Cardiovascular: No chest pain.  She has had palpitations Respiratory: No shortness of breath at rest or with exertion.   No wheezes.   Snores, possible OSA. GastrointestinaI: No nausea, vomiting, diarrhea, abdominal pain, fecal incontinence Genitourinary: No dysuria, urinary retention or frequency.  2 x  nocturia. Musculoskeletal: No neck pain, back pain Integumentary: No rash, pruritus, skin lesions Neurological: as above Psychiatric: No depression at this time.  No anxiety Endocrine: She sees endocrinology for a goiter and subclinical hyperthyroidism (Dr. Tawnya Crook).  No diaphoresis, change in appetite, change in weigh or increased thirst Hematologic/Lymphatic: No anemia, purpura, petechiae. Allergic/Immunologic: No itchy/runny eyes, nasal congestion, recent allergic reactions, rashes  ALLERGIES: Allergies  Allergen Reactions  . Benadryl [Diphenhydramine Hcl]   . Betaseron [Interferon Beta-1b] Rash    HOME MEDICATIONS:  Current Outpatient  Prescriptions:  .  Ascorbic Acid (VITAMIN C) 1000 MG tablet, Take by mouth., Disp: , Rfl:  .  dihydroergotamine (MIGRANAL) 4 MG/ML nasal spray, , Disp: , Rfl:  .  Fingolimod HCl 0.5 MG CAPS, Take by mouth., Disp: , Rfl:  .  ibuprofen (ADVIL,MOTRIN) 200 MG tablet, Take by mouth., Disp: , Rfl:  .  Lactobacillus Rhamnosus, GG, (CULTURELLE PO), Take by mouth 2 (two) times daily., Disp: , Rfl:  .  Lysine 500 MG TABS, Take by mouth., Disp: , Rfl:  .  Magnesium Gluconate 550 MG TABS, Take 400 mg by mouth., Disp: , Rfl:  .  methimazole (TAPAZOLE) 10 MG tablet, Take 10 mg by mouth daily., Disp: , Rfl:  .  ranitidine (ZANTAC) 150 MG tablet, Take 150 mg by mouth., Disp: , Rfl:  .  topiramate (TOPAMAX) 25 MG capsule, Take 25 mg by mouth 3 (three) times daily. Take one tablet in the morning and 2 at bedtime for  migraine prevention, Disp: , Rfl:  .  Turmeric 500 MG TABS, Take by mouth., Disp: , Rfl:   PAST MEDICAL HISTORY: Past Medical History:  Diagnosis Date  . Anxiety   . Bell's palsy   . Hypertension   . Migraine   . MS (multiple sclerosis) ([place])   . Seizures ([place])   . Thyroid disease   . Vision abnormalities     PAST SURGICAL HISTORY: Past Surgical History:  Procedure Laterality Date  . ENDOMETRIAL ABLATION    . TUBAL LIGATION      FAMILY HISTORY: Family History  Problem Relation Age of Onset  . Dementia Mother   . Osteoarthritis Mother   . Transient ischemic attack Mother   . Hypertension Mother   . Heart disease Father   . Hypertension Father   . Diabetes Mellitus II Father   . Migraines Sister   . Heart attack Brother   . Colon cancer Brother   . Diverticulitis Brother   . Healthy Brother   . Tremor Brother   . Healthy Brother     SOCIAL HISTORY:  Social History   Social History  . Marital status: Married    Spouse name: N/A  . Number of children: N/A  . Years of education: N/A   Occupational History  . Not on file.   Social History Main Topics  . Smoking  status: Never Smoker  . Smokeless tobacco: Never Used  . Alcohol use No  . Drug use: Unknown  . Sexual activity: Not on file   Other Topics Concern  . Not on file   Social History Narrative  . No narrative on file     PHYSICAL EXAM  Vitals:   08/08/16 0900  BP: 114/80  Pulse: 68  Resp: 18  Weight: 242 lb 8 oz (110 kg)  Height: 5\' 4"  (1.626 m)    Body mass index is 41.63 kg/m.   General: The patient is well-developed and well-nourished and in no acute distress  Eyes:  Funduscopic exam shows normal optic discs and retinal vessels.  Neck: The neck is supple, no carotid bruits are noted.  The neck is nontender.  Cardiovascular: The heart has a regular rate and rhythm with a normal S1 and S2. There were no murmurs, gallops or rubs. Lungs are clear to auscultation.  Skin: Extremities are without significant edema.  Musculoskeletal:  Back is nontender  Neurologic Exam  Mental status: The patient is alert and oriented x 3 at the time of the examination. The patient has apparent normal recent and remote memory, with an apparently normal attention span and concentration ability.   Speech is normal.  Cranial nerves: Extraocular movements are full. Pupils are equal, round, and reactive to light and accomodation.  Visual fields are full.  Facial symmetry is present. There is good facial sensation to soft touch bilaterally.Facial strength is normal.  Trapezius and sternocleidomastoid strength is normal. No dysarthria is noted.  The tongue is midline, and the patient has symmetric elevation of the soft palate. No obvious hearing deficits are noted.  Motor:  Muscle bulk is normal.   Tone is mildly increased in right leg.   Strength is  5 / 5 in all 4 extremities.   Sensory: Sensory testing is intact to pinprick, soft touch and vibration sensation in all 4 extremities.  Coordination: Cerebellar testing reveals good finger-nose-finger and heel-to-shin bilaterally.  Gait and  station: Station is normal.   Gait is normal. Tandem gait is mildly wide. Romberg  is negative.   Reflexes: Deep tendon reflexes are symmetric and normal bilaterally.        DIAGNOSTIC DATA (LABS, IMAGING, TESTING) - I reviewed patient records, labs, notes, testing and imaging myself where available.      ASSESSMENT AND PLAN  MS (multiple sclerosis) ([place]) - Plan: CBC with Differential/Platelet  Chronic fatigue  GAD (generalized anxiety disorder)  Migraine without aura and without status migrainosus, not intractable  Snoring - Plan: Home sleep test  OSA (obstructive sleep apnea) - Plan: Home sleep test  High risk medication use - Plan: CBC with Differential/Platelet   In summary, Abigaile Rossie is a 56 year old woman with multiple sclerosis who is currently doing very well on Gilenya with no tolerability concerns, no recent exacerbations, no progression of disability and no change on her current MRI.   I will check a CBC to make sure that the lymphocyte count is not too low and adjust the dose if it is.   Her main symptom at this point is fatigue. This is more physical and cognitive. She also has mild sleepiness in the evenings. Her husband describes a breathing pattern at night that could be sleep apnea. We will check a home sleep study. If she has moderate or severe sleep apnea, CPAP titration could be considered. Additionally I advised weight loss.   We'll continue Topamax for her migraines.  She will return to see me in 6 months but sooner if she has any new or worsening neurologic symptoms or based on the results of the home sleep study and subsequent evaluation.  Thank you for asking me to see Mrs. Mccutcheon for her neurologic consultation. Please let me know if I can be of further assistance with her or other patients in the future.   Arsenio Schnorr A. Felecia Shelling, MD, PhD 9/79/4801, 6:55 AM Certified in Neurology, Clinical Neurophysiology, Sleep Medicine, Pain Medicine and  Neuroimaging  [facility] [address] [place], [place] [address] [phone]

## 2016-08-08 NOTE — Telephone Encounter (Signed)
Agustin/[facility] [phone] REF# [id number] needs clarification on the directions for topiramate (TOPAMAX) 25 MG capsule

## 2016-08-09 LAB — CBC WITH DIFFERENTIAL/PLATELET
BASOS ABS: 0 10*3/uL (ref 0.0–0.2)
BASOS: 0 %
EOS (ABSOLUTE): 0.1 10*3/uL (ref 0.0–0.4)
Eos: 2 %
HEMOGLOBIN: 14.2 g/dL (ref 11.1–15.9)
Hematocrit: 43.3 % (ref 34.0–46.6)
IMMATURE GRANS (ABS): 0 10*3/uL (ref 0.0–0.1)
Immature Granulocytes: 0 %
LYMPHS: 16 %
Lymphocytes Absolute: 0.7 10*3/uL (ref 0.7–3.1)
MCH: 29.1 pg (ref 26.6–33.0)
MCHC: 32.8 g/dL (ref 31.5–35.7)
MCV: 89 fL (ref 79–97)
MONOCYTES: 11 %
Monocytes Absolute: 0.5 10*3/uL (ref 0.1–0.9)
NEUTROS ABS: 3.2 10*3/uL (ref 1.4–7.0)
Neutrophils: 71 %
Platelets: 267 10*3/uL (ref 150–379)
RBC: 4.88 x10E6/uL (ref 3.77–5.28)
RDW: 13.9 % (ref 12.3–15.4)
WBC: 4.5 10*3/uL (ref 3.4–10.8)

## 2016-08-10 ENCOUNTER — Telehealth: Payer: Self-pay | Admitting: *Deleted

## 2016-08-10 NOTE — Telephone Encounter (Signed)
Called and spoke with pt about labs per RS,MD note. Pt verbalized understanding.

## 2016-08-10 NOTE — Telephone Encounter (Signed)
-----   Message from Britt Bottom, MD sent at 08/09/2016  4:49 PM EDT ----- Please let the patient know that the lab work is fine.

## 2016-08-30 ENCOUNTER — Ambulatory Visit: Payer: Self-pay | Admitting: Neurology

## 2016-09-24 ENCOUNTER — Ambulatory Visit (INDEPENDENT_AMBULATORY_CARE_PROVIDER_SITE_OTHER): Payer: PRIVATE HEALTH INSURANCE | Admitting: Neurology

## 2016-09-24 DIAGNOSIS — G4733 Obstructive sleep apnea (adult) (pediatric): Secondary | ICD-10-CM | POA: Diagnosis not present

## 2016-09-24 DIAGNOSIS — R0683 Snoring: Secondary | ICD-10-CM

## 2016-09-26 NOTE — Progress Notes (Signed)
[facility] [place], [place], [place] [address]  NAME:   Shelly Boyd DOB:  1960-05-27 MEDICAL RECORD NUMBER [id number] DOS:   09/25/2016 REFERRING PHYSICIAN:   Arlice Colt, MD, PhD   Study Performed:  HST/Out of Center Sleep Test  HISTORY:   She is a 56 yo woman with MS, fatigue, and witnessed sleep disordered breathing.     STUDY RESULTS:  Total Recording Time:  7h 18m  Total Apnea/Hypopnea Index (AHI):  12.8 / hr  Average Oxygen Saturation:    94%  Lowest Oxygen Saturation:  72%   Average Mean Heart Rate:     66   IMPRESSION:  HST shows mild obstructive sleep apnea (OSA) with an AHI = 12.8/hr.    RECOMMENDATION:   1.   Her mild OSA could be treated with weight loss or an oral appliance.     I certify that I have reviewed the raw data recording prior to the issuance of this report in accordance with the standards of Accreditation of the American Academy of Sleep medicine (AASM).   Richard A. Felecia Shelling, MD, PhD Certified in Neurology, Clinical Neurophysiology, Sleep Medicine, Pain Medicine and Neuroimaging Director, [place] [facility] [address], [place], [place] [address] [phone]

## 2017-02-12 ENCOUNTER — Encounter: Payer: Self-pay | Admitting: Neurology

## 2017-02-12 ENCOUNTER — Ambulatory Visit (INDEPENDENT_AMBULATORY_CARE_PROVIDER_SITE_OTHER): Payer: PRIVATE HEALTH INSURANCE | Admitting: Neurology

## 2017-02-12 VITALS — BP 144/90 | HR 89 | Resp 18 | Ht 64.0 in | Wt 244.5 lb

## 2017-02-12 DIAGNOSIS — G4733 Obstructive sleep apnea (adult) (pediatric): Secondary | ICD-10-CM | POA: Diagnosis not present

## 2017-02-12 DIAGNOSIS — G35 Multiple sclerosis: Secondary | ICD-10-CM

## 2017-02-12 DIAGNOSIS — G43009 Migraine without aura, not intractable, without status migrainosus: Secondary | ICD-10-CM

## 2017-02-12 DIAGNOSIS — Z6841 Body Mass Index (BMI) 40.0 and over, adult: Secondary | ICD-10-CM | POA: Diagnosis not present

## 2017-02-12 DIAGNOSIS — R5382 Chronic fatigue, unspecified: Secondary | ICD-10-CM

## 2017-02-12 MED ORDER — PHENTERMINE HCL 30 MG PO CAPS: 30.0000 mg | ORAL_CAPSULE | ORAL | 5 refills | Status: DC

## 2017-02-12 NOTE — Progress Notes (Signed)
[facility]  PATIENT: Shelly Boyd DOB: Oct 07, 1960  REFERRING DOCTOR OR PCP:  Dr. Maye Hides, PCP is [name] SOURCE: patient, notes rom Dr. Starleen Blue, lab/imaging results, MRI images on CD/PACS  _________________________________   HISTORICAL  CHIEF COMPLAINT:  Chief Complaint  Patient presents with  . Multiple Sclerosis    Sts. she continues to tolerate Gilenya well.  Migraines ok with Topamax. Has been resting more, so fatigue is improved. Planning wt. loss to help with mild OSA/fim    HISTORY OF PRESENT ILLNESS:  Shelly Boyd is a 56 year old woman who was diagnosed with MS in 2012  Update 02/12/2017:   She feels that her MS is stable. Specifically she has not had any exacerbations just in gait, strength or sensation. She can walk in high heels.  She remains on Gilenya and has tolerated it well.   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   CBC with diff, TSH and CMP drawn 01/30/2017 and labs are fine. The MRI of the brain 05/09/2016 did not show any acute findings.     Vision is doing well.    Her bladder function is good.      Her main problem has been fatigue.    After the last visit we also checked a home sleep study. She had mild OSA with AHI = 12.8.     She actually feels fatigue and sleepiness are better since the ;ast visit as she is trying to get more sleep.     Mood si doing ok but she has stress with her mother's health issues (dementia).    Migraines are doing better on Topamax.   Now she only gets migraines if changes in weather or if she eats onions.   Migranal helps with most migraines and was better than sumatriptan.       ________________________________ From 08/08/2016: MS History:   She was diagnosed with MS in 2012 after presenting with pain and numbness in the right arm and blurry vision. Her ophthalmologist diagnosed her with nystagmus. She was referred to Dr. Starleen Blue in  advance and was diagnosed with MS after an MRI. Combination of her symptoms and classic MRI were sufficient to diagnose MS without the need to undergo a lumbar puncture. She received 3 days of IV Solu-Medrol and was back to baseline in 3 weeks..  Initially, she was placed on Betaseron. She had a rash with that and was switched to Tysabri. After 42 infusions, she switched due to a high JCV antibody titer (0.66).  Since February 2017 she has been on Gilenya. She tolerates it well and has not had any exacerbations.    Her last MRI of the brain was performed December 2017. Personally reviewed the images. She she has multiple T2/FLAIR hyperintense foci, predominantly in the periventricular white matter. Most are radially oriented to the ventricles and some are hypointense on T1-weighted images. None of the foci enhanced. There were no acute findings.    Gait/strength/sensation: She feels that her gait is good and she does not report difficulties with her balance. She does not note any weakness in general. However, when she gets hot or tired the right leg will be slightly weak and also sometimes she will have more spasticity in that leg. She does sometimes note some tingling or pain in the right arm and leg, usually when she is either hot or tired.  Vision: She denies any current issue with her visual acuity.  There is no double vision. There is no nystagmus. There is no eye pain.  Bladder/bowel: She denies any difficulty with urinary frequency, urgency or hesitancy. She does have 2 times nocturia. There is no incontinence. She denies any bowel issues.   Fatigue/sleep:      She notes fatigue that is more physical and cognitive. Fatigue is worse when she is hot. She sleeps 6-6-1/2 hours a night but does not always feel refreshed when she wakes up. She will usually wake up twice a night to use the bathroom but will fall back asleep. She has been noted to snore. There is some irregularity in her breathing and her  husband describes possible sleep apnea.  Mood/cognition: She denies any depression but does note some anxiety. She has some stress taking care of her mother who is in assisted living. She denies any significant issues with cognitive function. Specifically, there is no problems with memory, verbal fluency or executive function.   EPWORTH SLEEPINESS SCALE  On a scale of 0 - 3 what is the chance of dozing:  Sitting and Reading:   2 Watching TV:    3 Sitting inactive in a public place: 0 Passenger in car for one hour: 0 Lying down to rest in the afternoon: 3 Sitting and talking to someone: 0 Sitting quietly after lunch:  0 In a car, stopped in traffic:  0  Total (out of 24):      8/24    Migraine headaches. She has common migraine headaches with throbbing pain that occurs a few times a month. She has done much better on Topamax 25 mg in the morning and 50 and night.    She takes a Migranal if a a migraine occurs.   REVIEW OF SYSTEMS: Constitutional: No fevers, chills, sweats, or change in appetite.   She reports fatigue.   Has gained a few pounds over the past year.   Eyes: No visual changes, double vision, eye pain Ear, nose and throat: No hearing loss, ear pain, nasal congestion, sore throat Cardiovascular: No chest pain.  She has had palpitations Respiratory: No shortness of breath at rest or with exertion.   No wheezes.   Snores, possible OSA. GastrointestinaI: No nausea, vomiting, diarrhea, abdominal pain, fecal incontinence Genitourinary: No dysuria, urinary retention or frequency.  2 x  nocturia. Musculoskeletal: No neck pain, back pain Integumentary: No rash, pruritus, skin lesions Neurological: as above Psychiatric: No depression at this time.  No anxiety Endocrine: She sees endocrinology for a goiter and subclinical hyperthyroidism (Dr. Tawnya Crook).  No diaphoresis, change in appetite, change in weigh or increased thirst Hematologic/Lymphatic: No anemia, purpura,  petechiae. Allergic/Immunologic: No itchy/runny eyes, nasal congestion, recent allergic reactions, rashes  ALLERGIES: Allergies  Allergen Reactions  . Benadryl [Diphenhydramine Hcl]   . Betaseron [Interferon Beta-1b] Rash    HOME MEDICATIONS:  Current Outpatient Prescriptions:  .  Ascorbic Acid (VITAMIN C) 1000 MG tablet, Take by mouth., Disp: , Rfl:  .  dihydroergotamine (MIGRANAL) 4 MG/ML nasal spray, , Disp: , Rfl:  .  Fingolimod HCl 0.5 MG CAPS, Take by mouth., Disp: , Rfl:  .  ibuprofen (ADVIL,MOTRIN) 200 MG tablet, Take by mouth., Disp: , Rfl:  .  Lactobacillus Rhamnosus, GG, (CULTURELLE PO), Take by mouth 2 (two) times daily., Disp: , Rfl:  .  Lysine 500 MG TABS, Take by mouth., Disp: , Rfl:  .  Magnesium Gluconate 550 MG TABS, Take 400 mg by mouth., Disp: , Rfl:  .  methimazole (TAPAZOLE) 10  MG tablet, Take 10 mg by mouth daily., Disp: , Rfl:  .  ranitidine (ZANTAC) 150 MG tablet, Take 150 mg by mouth., Disp: , Rfl:  .  topiramate (TOPAMAX) 25 MG capsule, Take 1 capsule (25 mg total) by mouth 3 (three) times daily. Take one tablet in the morning and 2 at bedtime for migraine prevention, Disp: 270 capsule, Rfl: 3 .  Turmeric 500 MG TABS, Take by mouth., Disp: , Rfl:  .  phentermine 30 MG capsule, Take 1 capsule (30 mg total) by mouth every morning., Disp: 30 capsule, Rfl: 5  PAST MEDICAL HISTORY: Past Medical History:  Diagnosis Date  . Anxiety   . Bell's palsy   . Hypertension   . Migraine   . MS (multiple sclerosis) ([place])   . Seizures ([place])   . Thyroid disease   . Vision abnormalities     PAST SURGICAL HISTORY: Past Surgical History:  Procedure Laterality Date  . ENDOMETRIAL ABLATION    . TUBAL LIGATION      FAMILY HISTORY: Family History  Problem Relation Age of Onset  . Dementia Mother   . Osteoarthritis Mother   . Transient ischemic attack Mother   . Hypertension Mother   . Heart disease Father   . Hypertension Father   . Diabetes Mellitus II Father    . Migraines Sister   . Heart attack Brother   . Colon cancer Brother   . Diverticulitis Brother   . Healthy Brother   . Tremor Brother   . Healthy Brother     SOCIAL HISTORY:  Social History   Social History  . Marital status: Married    Spouse name: N/A  . Number of children: N/A  . Years of education: N/A   Occupational History  . Not on file.   Social History Main Topics  . Smoking status: Never Smoker  . Smokeless tobacco: Never Used  . Alcohol use No  . Drug use: Unknown  . Sexual activity: Not on file   Other Topics Concern  . Not on file   Social History Narrative  . No narrative on file     PHYSICAL EXAM  Vitals:   02/12/17 0822  BP: (!) 144/90  Pulse: 89  Resp: 18  Weight: 244 lb 8 oz (110.9 kg)  Height: 5\' 4"  (1.626 m)    Body mass index is 41.97 kg/m.   General: The patient is well-developed and well-nourished and in no acute distress   Neurologic Exam  Mental status: The patient is alert and oriented x 3 at the time of the examination. The patient has apparent normal recent and remote memory, with an apparently normal attention span and concentration ability.   Speech is normal.  Cranial nerves: Extraocular movements are full. Facial strength and sensation is normal. Trapezius strength is normal..  The tongue is midline, and the patient has symmetric elevation of the soft palate. No obvious hearing deficits are noted.  Motor:  Muscle bulk is normal.   Tone is mildly increased in right leg.   Strength is  5 / 5 in all 4 extremities.   Sensory: She has intact sensation to touch and vibration in the arms and legs.  Coordination: Cerebellar testing reveals good finger-nose-finger and heel-to-shin bilaterally.  Gait and station: Station is normal.   The gait is normal. The tandem gait is mildly wide. Romberg is negative..   Reflexes: Deep tendon reflexes are symmetric and normal bilaterally.        DIAGNOSTIC  DATA (LABS, IMAGING,  TESTING) - I reviewed patient records, labs, notes, testing and imaging myself where available.      ASSESSMENT AND PLAN  MS (multiple sclerosis) (HCC)  OSA (obstructive sleep apnea)  Migraine without aura and without status migrainosus, not intractable  Chronic fatigue  Class 3 severe obesity due to excess calories without serious comorbidity with body mass index (BMI) of 40.0 to 44.9 in adult ([place])   1.  Continue Gilenya. She has recent blood work which was fine. Next year we will recheck an MRI of the brain to make sure that she is not experiencing subclinical progression. If that occurs, we will need to consider a different medication. 2.  We discussed weight loss to help her phentermine and fatigue. A prescription for phentermine was provided. She will continue Topamax for the migraines.  3.   She will return to see me in 6 months but sooner if she has any new or worsening neurologic symptoms    Richard A. Felecia Shelling, MD, PhD 10/08/7469, 5:95 AM Certified in Neurology, Clinical Neurophysiology, Sleep Medicine, Pain Medicine and Neuroimaging  [facility] [address], [place], [place] [address] [phone]

## 2017-08-12 ENCOUNTER — Encounter: Payer: Self-pay | Admitting: Neurology

## 2017-08-12 ENCOUNTER — Other Ambulatory Visit: Payer: Self-pay

## 2017-08-12 ENCOUNTER — Ambulatory Visit (INDEPENDENT_AMBULATORY_CARE_PROVIDER_SITE_OTHER): Payer: PRIVATE HEALTH INSURANCE | Admitting: Neurology

## 2017-08-12 ENCOUNTER — Encounter (INDEPENDENT_AMBULATORY_CARE_PROVIDER_SITE_OTHER): Payer: Self-pay

## 2017-08-12 ENCOUNTER — Telehealth: Payer: Self-pay | Admitting: Neurology

## 2017-08-12 VITALS — BP 137/85 | HR 79 | Resp 18 | Ht 64.0 in | Wt 237.5 lb

## 2017-08-12 DIAGNOSIS — G43009 Migraine without aura, not intractable, without status migrainosus: Secondary | ICD-10-CM

## 2017-08-12 DIAGNOSIS — F411 Generalized anxiety disorder: Secondary | ICD-10-CM

## 2017-08-12 DIAGNOSIS — G4733 Obstructive sleep apnea (adult) (pediatric): Secondary | ICD-10-CM

## 2017-08-12 DIAGNOSIS — G35 Multiple sclerosis: Secondary | ICD-10-CM

## 2017-08-12 DIAGNOSIS — R5382 Chronic fatigue, unspecified: Secondary | ICD-10-CM

## 2017-08-12 MED ORDER — DIHYDROERGOTAMINE MESYLATE 4 MG/ML NA SOLN: 1.0000 | NASAL | 11 refills | Status: DC | PRN

## 2017-08-12 MED ORDER — TOPIRAMATE 25 MG PO TABS: ORAL_TABLET | ORAL | 3 refills | Status: DC

## 2017-08-12 NOTE — Telephone Encounter (Signed)
[employer] auth: [id number] (exp. 08/12/17 to 09/11/17) order sent to GI they will reach out to the patient to schedule.

## 2017-08-12 NOTE — Progress Notes (Signed)
[facility]  PATIENT: Shelly Boyd DOB: 10/12/1960  REFERRING DOCTOR OR PCP:  Dr. Maye Hides, PCP is [name] SOURCE: patient, notes rom Dr. Starleen Blue, lab/imaging results, MRI images on CD/PACS  _________________________________   HISTORICAL  CHIEF COMPLAINT:  Chief Complaint  Patient presents with  . Multiple Sclerosis    Sts. she continues to tolerate Gilenya well.  Stopped Phentermine b/c it made her feel jittery.  Sts. h/a's are same frequency but more severe, noted the most with weather changes.  Needs r/f of Topamax and Migranol. Would like Topamax tablets instead of capsules/fim  . Sleep Apnea  . Headache    HISTORY OF PRESENT ILLNESS:  Shelly Boyd is a 57 y.o. woman who was diagnosed with MS in 2012  Update 08/12/2017: She reports that her MS has been stable.  She is on Gilenya.  She tolerates it well and there have been no exacerbations.  Neurologically she feels she is doing well.  She has no major problems with gait, strength or sensation.  When tired, she notes mild fatigue.   She had one fall when she slipped but feels it is not related to MS.   Bladder function is fine.  Vision is fine.   She does worse when overheated.     She continues to have a lot of fatigue.  She stopped the phentermine because it made her feel generally.  She has mild OSA.  Mood is doing about the same.  She notes less stress with her mom who has Alzheimer's as she is now in a good memory unit nursing home.  .    She continues to report a lot of of migraine headaches.  Changes in weather often trigger a headache.  She has 1-2 HA's a month but they are very severe and she needs to leave work.   Topamax has helped some.  Migranal also helps and it works better than triptan's but she needs to take it early for any effect.      Update 02/12/2017:   She feels that her MS is stable. Specifically she has not had any exacerbations just in gait, strength or sensation. She can  walk in high heels.  She remains on Gilenya and has tolerated it well.   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   CBC with diff, TSH and CMP drawn 01/30/2017 and labs are fine. The MRI of the brain 05/09/2016 did not show any acute findings.     Vision is doing well.    Her bladder function is good.      Her main problem has been fatigue.    After the last visit we also checked a home sleep study. She had mild OSA with AHI = 12.8.     She actually feels fatigue and sleepiness are better since the ;ast visit as she is trying to get more sleep.     Mood si doing ok but she has stress with her mother's health issues (dementia).    Migraines are doing better on Topamax.   Now she only gets migraines if changes in weather or if she eats onions.   Migranal helps with most migraines and was better than sumatriptan.       ________________________________ From 08/08/2016: MS History:   She was diagnosed with MS in 2012 after presenting with pain and numbness in the right arm and blurry vision. Her ophthalmologist diagnosed her with nystagmus. She  was referred to Dr. Starleen Blue in advance and was diagnosed with MS after an MRI. Combination of her symptoms and classic MRI were sufficient to diagnose MS without the need to undergo a lumbar puncture. She received 3 days of IV Solu-Medrol and was back to baseline in 3 weeks..  Initially, she was placed on Betaseron. She had a rash with that and was switched to Tysabri. After 42 infusions, she switched due to a high JCV antibody titer (0.66).  Since February 2017 she has been on Gilenya. She tolerates it well and has not had any exacerbations.    Her last MRI of the brain was performed December 2017. Personally reviewed the images. She she has multiple T2/FLAIR hyperintense foci, predominantly in the periventricular white matter. Most are radially oriented to the ventricles and some are hypointense on T1-weighted  images. None of the foci enhanced. There were no acute findings.    Gait/strength/sensation: She feels that her gait is good and she does not report difficulties with her balance. She does not note any weakness in general. However, when she gets hot or tired the right leg will be slightly weak and also sometimes she will have more spasticity in that leg. She does sometimes note some tingling or pain in the right arm and leg, usually when she is either hot or tired.  Vision: She denies any current issue with her visual acuity. There is no double vision. There is no nystagmus. There is no eye pain.  Bladder/bowel: She denies any difficulty with urinary frequency, urgency or hesitancy. She does have 2 times nocturia. There is no incontinence. She denies any bowel issues.   Fatigue/sleep:      She notes fatigue that is more physical and cognitive. Fatigue is worse when she is hot. She sleeps 6-6-1/2 hours a night but does not always feel refreshed when she wakes up. She will usually wake up twice a night to use the bathroom but will fall back asleep. She has been noted to snore. There is some irregularity in her breathing and her husband describes possible sleep apnea.  Mood/cognition: She denies any depression but does note some anxiety. She has some stress taking care of her mother who is in assisted living. She denies any significant issues with cognitive function. Specifically, there is no problems with memory, verbal fluency or executive function.   EPWORTH SLEEPINESS SCALE  On a scale of 0 - 3 what is the chance of dozing:  Sitting and Reading:   2 Watching TV:    3 Sitting inactive in a public place: 0 Passenger in car for one hour: 0 Lying down to rest in the afternoon: 3 Sitting and talking to someone: 0 Sitting quietly after lunch:  0 In a car, stopped in traffic:  0  Total (out of 24):      8/24    Migraine headaches. She has common migraine headaches with throbbing pain that  occurs a few times a month. She has done much better on Topamax 25 mg in the morning and 50 and night.    She takes a Migranal if a a migraine occurs.   REVIEW OF SYSTEMS: Constitutional: No fevers, chills, sweats, or change in appetite.   She reports fatigue.   Has gained a few pounds over the past year.   Eyes: No visual changes, double vision, eye pain Ear, nose and throat: No hearing loss, ear pain, nasal congestion, sore throat Cardiovascular: No chest pain.  She has had palpitations  Respiratory: No shortness of breath at rest or with exertion.   No wheezes.   Snores, possible OSA. GastrointestinaI: No nausea, vomiting, diarrhea, abdominal pain, fecal incontinence Genitourinary: No dysuria, urinary retention or frequency.  2 x  nocturia. Musculoskeletal: No neck pain, back pain Integumentary: No rash, pruritus, skin lesions Neurological: as above Psychiatric: No depression at this time.  No anxiety Endocrine: She sees endocrinology for a goiter and subclinical hyperthyroidism (Dr. Tawnya Crook).  No diaphoresis, change in appetite, change in weigh or increased thirst Hematologic/Lymphatic: No anemia, purpura, petechiae. Allergic/Immunologic: No itchy/runny eyes, nasal congestion, recent allergic reactions, rashes  ALLERGIES: Allergies  Allergen Reactions  . Benadryl [Diphenhydramine Hcl]   . Betaseron [Interferon Beta-1b] Rash    HOME MEDICATIONS:  Current Outpatient Medications:  .  Ascorbic Acid (VITAMIN C) 1000 MG tablet, Take by mouth., Disp: , Rfl:  .  dihydroergotamine (MIGRANAL) 4 MG/ML nasal spray, , Disp: , Rfl:  .  Fingolimod HCl 0.5 MG CAPS, Take by mouth., Disp: , Rfl:  .  ibuprofen (ADVIL,MOTRIN) 200 MG tablet, Take by mouth., Disp: , Rfl:  .  Lactobacillus Rhamnosus, GG, (CULTURELLE PO), Take by mouth 2 (two) times daily., Disp: , Rfl:  .  Lysine 500 MG TABS, Take by mouth., Disp: , Rfl:  .  Magnesium Gluconate 550 MG TABS, Take 400 mg by mouth., Disp: , Rfl:  .   methimazole (TAPAZOLE) 10 MG tablet, Take 10 mg by mouth daily., Disp: , Rfl:  .  ranitidine (ZANTAC) 150 MG tablet, Take 150 mg by mouth., Disp: , Rfl:  .  Turmeric 500 MG TABS, Take by mouth., Disp: , Rfl:  .  phentermine 30 MG capsule, Take 1 capsule (30 mg total) by mouth every morning. (Patient not taking: Reported on 08/12/2017), Disp: 30 capsule, Rfl: 5 .  topiramate (TOPAMAX) 25 MG tablet, Take one tablet in the morning and 3 tablets at night, Disp: 360 tablet, Rfl: 3  PAST MEDICAL HISTORY: Past Medical History:  Diagnosis Date  . Anxiety   . Bell's palsy   . Hypertension   . Migraine   . MS (multiple sclerosis) ([place])   . Seizures ([place])   . Thyroid disease   . Vision abnormalities     PAST SURGICAL HISTORY: Past Surgical History:  Procedure Laterality Date  . ENDOMETRIAL ABLATION    . TUBAL LIGATION      FAMILY HISTORY: Family History  Problem Relation Age of Onset  . Dementia Mother   . Osteoarthritis Mother   . Transient ischemic attack Mother   . Hypertension Mother   . Heart disease Father   . Hypertension Father   . Diabetes Mellitus II Father   . Migraines Sister   . Heart attack Brother   . Colon cancer Brother   . Diverticulitis Brother   . Healthy Brother   . Tremor Brother   . Healthy Brother     SOCIAL HISTORY:  Social History   Socioeconomic History  . Marital status: Married    Spouse name: Not on file  . Number of children: Not on file  . Years of education: Not on file  . Highest education level: Not on file  Occupational History  . Not on file  Social Needs  . Financial resource strain: Not on file  . Food insecurity:    Worry: Not on file    Inability: Not on file  . Transportation needs:    Medical: Not on file    Non-medical: Not on file  Tobacco Use  . Smoking status: Never Smoker  . Smokeless tobacco: Never Used  Substance and Sexual Activity  . Alcohol use: No  . Drug use: Not on file  . Sexual activity: Not on file    Lifestyle  . Physical activity:    Days per week: Not on file    Minutes per session: Not on file  . Stress: Not on file  Relationships  . Social connections:    Talks on phone: Not on file    Gets together: Not on file    Attends religious service: Not on file    Active member of club or organization: Not on file    Attends meetings of clubs or organizations: Not on file    Relationship status: Not on file  . Intimate partner violence:    Fear of current or ex partner: Not on file    Emotionally abused: Not on file    Physically abused: Not on file    Forced sexual activity: Not on file  Other Topics Concern  . Not on file  Social History Narrative  . Not on file     PHYSICAL EXAM  Vitals:   08/12/17 0829  BP: 137/85  Pulse: 79  Resp: 18  Weight: 237 lb 8 oz (107.7 kg)  Height: 5\' 4"  (1.626 m)    Body mass index is 40.77 kg/m.   General: The patient is well-developed and well-nourished and in no acute distress.   Neck is nontender with good ROM   Neurologic Exam  Mental status: The patient is alert and oriented x 3 at the time of the examination. The patient has apparent normal recent and remote memory, with an apparently normal attention span and concentration ability.   Speech is normal.  Cranial nerves: Extraocular movements are full. Normal facial strength and sensation. . Trapezius strength is normal..  The tongue is midline, and the patient has symmetric elevation of the soft palate. No obvious hearing deficits are noted.  Motor:  Muscle bulk is normal.   Tone is normal in the arms but mildly increased in the right leg. .   Strength is  5 / 5 in all 4 extremities.   Sensory: She has normal touch sensation in limbs  Coordination: Cerebellar testing shows good Finger nose finger  Gait and station: Station is normal.   The gait is normal. The tandem gait is mildly wide but she is wearing low heels. . Romberg is negative..   Reflexes: Deep tendon reflexes  are symmetric and normal bilaterally.        DIAGNOSTIC DATA (LABS, IMAGING, TESTING) - I reviewed patient records, labs, notes, testing and imaging myself where available.      ASSESSMENT AND PLAN  MS (multiple sclerosis) ([place])  Multiple sclerosis (HCC)  Migraine without aura and without status migrainosus, not intractable  OSA (obstructive sleep apnea)  Chronic fatigue  GAD (generalized anxiety disorder)   1.  She will continue Gilenya.  We would need to check a CBC with differential today to make sure she does not have very severe lymphopenia.  I explained that a low lymphocyte count is expected.  We will check an MRI of the brain to make sure that she is not experiencing subclinical progression. If that occurs, we will need to consider a different medication. 2.  Increase topiramate to 25 mg in am and 75 mg at night for migraines.   We discussed adding buproprion for weight and fatigue and she will  consider this.    Also discussed buspar if anxiety worsens.    3.   She will return to see me in 6 months but sooner if she has any new or worsening neurologic symptoms    Richard A. Felecia Shelling, MD, PhD 4/38/3818, 4:03 AM Certified in Neurology, Clinical Neurophysiology, Sleep Medicine, Pain Medicine and Neuroimaging  [facility] [address], [place], [place] [address] [phone]

## 2017-08-13 ENCOUNTER — Telehealth: Payer: Self-pay | Admitting: *Deleted

## 2017-08-13 LAB — CBC WITH DIFFERENTIAL/PLATELET
BASOS: 1 %
Basophils Absolute: 0 10*3/uL (ref 0.0–0.2)
EOS (ABSOLUTE): 0.1 10*3/uL (ref 0.0–0.4)
EOS: 2 %
HEMATOCRIT: 42.1 % (ref 34.0–46.6)
Hemoglobin: 13.5 g/dL (ref 11.1–15.9)
IMMATURE GRANS (ABS): 0 10*3/uL (ref 0.0–0.1)
IMMATURE GRANULOCYTES: 0 %
LYMPHS: 17 %
Lymphocytes Absolute: 0.6 10*3/uL — ABNORMAL LOW (ref 0.7–3.1)
MCH: 28.9 pg (ref 26.6–33.0)
MCHC: 32.1 g/dL (ref 31.5–35.7)
MCV: 90 fL (ref 79–97)
MONOS ABS: 0.2 10*3/uL (ref 0.1–0.9)
Monocytes: 6 %
NEUTROS ABS: 2.7 10*3/uL (ref 1.4–7.0)
NEUTROS PCT: 74 %
Platelets: 267 10*3/uL (ref 150–379)
RBC: 4.67 x10E6/uL (ref 3.77–5.28)
RDW: 14.2 % (ref 12.3–15.4)
WBC: 3.7 10*3/uL (ref 3.4–10.8)

## 2017-08-13 NOTE — Telephone Encounter (Signed)
-----   Message from Britt Bottom, MD sent at 08/13/2017  9:52 AM EDT ----- Please let the patient know that the lab work is fine.

## 2017-08-13 NOTE — Telephone Encounter (Signed)
LMOM with below lab results.  She does not need to return this call unless she has questions/fim 

## 2017-08-19 ENCOUNTER — Other Ambulatory Visit: Payer: Self-pay | Admitting: Neurology

## 2017-08-22 ENCOUNTER — Ambulatory Visit
Admission: RE | Admit: 2017-08-22 | Discharge: 2017-08-22 | Disposition: A | Discharge status: Home / Self Care | Payer: No Typology Code available for payment source | Source: Ambulatory Visit | Attending: Neurology | Admitting: Neurology

## 2017-08-22 DIAGNOSIS — G35 Multiple sclerosis: Secondary | ICD-10-CM | POA: Diagnosis not present

## 2017-08-22 MED ORDER — GADOBENATE DIMEGLUMINE 529 MG/ML IV SOLN
20.0000 mL | Freq: Once | INTRAVENOUS | Status: AC | PRN
  Administered 2017-08-22: 20 mL via INTRAVENOUS

## 2017-08-26 ENCOUNTER — Telehealth: Payer: Self-pay | Admitting: *Deleted

## 2017-08-26 NOTE — Telephone Encounter (Signed)
LMOM (identified vm) with below MRI results.  She does not need to return this call unless she has questions/fim 

## 2017-08-26 NOTE — Telephone Encounter (Signed)
LMTC (identified vm)/fim 

## 2017-08-26 NOTE — Telephone Encounter (Signed)
Pt returned RN's call. She has a question and can be reached at [phone]

## 2017-08-26 NOTE — Telephone Encounter (Signed)
Spoke with Shelly Boyd and reviewed MRI report: No new MS lesions.  MRI showed mult. old lesions, none look new, and when RAS compared this MRI to the one she had in December 2017, there did not appear to be any change.  She verbalized understanding of same/fim

## 2017-08-26 NOTE — Telephone Encounter (Signed)
-----   Message from Britt Bottom, MD sent at 08/23/2017  5:01 PM EDT ----- Please let her know that the MRI of the brain shows no new lesions.

## 2017-10-31 ENCOUNTER — Telehealth: Payer: Self-pay | Admitting: *Deleted

## 2017-10-31 NOTE — Telephone Encounter (Signed)
PA for Gilenya 0.5mg  capsules completed and faxed to [facility], fax# [phone].  Dx: RRMS (G35).  Tried and failed meds: Betaseron (allergy) and Tysabri (high JCV titer)/fim

## 2017-11-04 NOTE — Telephone Encounter (Signed)
Fax received from [facility] PA approved for dates 11/04/17 thru 11/05/19.  PA# [employer] [phone] JA/fim

## 2018-02-12 ENCOUNTER — Encounter: Payer: Self-pay | Admitting: Neurology

## 2018-02-12 ENCOUNTER — Other Ambulatory Visit: Payer: Self-pay

## 2018-02-12 ENCOUNTER — Ambulatory Visit (INDEPENDENT_AMBULATORY_CARE_PROVIDER_SITE_OTHER): Payer: PRIVATE HEALTH INSURANCE | Admitting: Neurology

## 2018-02-12 VITALS — BP 140/89 | HR 76 | Resp 18 | Ht 64.0 in | Wt 244.0 lb

## 2018-02-12 DIAGNOSIS — G43009 Migraine without aura, not intractable, without status migrainosus: Secondary | ICD-10-CM | POA: Diagnosis not present

## 2018-02-12 DIAGNOSIS — G35 Multiple sclerosis: Secondary | ICD-10-CM | POA: Diagnosis not present

## 2018-02-12 DIAGNOSIS — R5382 Chronic fatigue, unspecified: Secondary | ICD-10-CM

## 2018-02-12 DIAGNOSIS — Z79899 Other long term (current) drug therapy: Secondary | ICD-10-CM | POA: Diagnosis not present

## 2018-02-12 MED ORDER — SUMATRIPTAN SUCCINATE 50 MG PO TABS: 50.0000 mg | ORAL_TABLET | ORAL | 3 refills | Status: DC | PRN

## 2018-02-12 NOTE — Progress Notes (Signed)
[facility]  PATIENT: Shelly Boyd DOB: 05-Nov-1960  REFERRING DOCTOR OR PCP:  Dr. Maye Hides, PCP is [name] SOURCE: patient, notes rom Dr. Starleen Blue, lab/imaging results, MRI images on CD/PACS  _________________________________   HISTORICAL  CHIEF COMPLAINT:  Chief Complaint  Patient presents with  . Multiple Sclerosis    Sts. she continues to tolerate Gilenya well.  Sts. h/a's are same frequency, more severe, seem to last longer, so doesn't feel increasing Topamax helped much. Sts. h/a's do tend to be worse in the spring and fall. Sts. [facility] will no longer cover Migranal nasal spray. Sts. she has tried and failed Imitrex, Relpax, Maxalt (all not tolerated), and Zomig (ineffective)/fim  . Migraines    HISTORY OF PRESENT ILLNESS:  Shelly Boyd is a 57 y.o. woman who was diagnosed with MS in 2012  Update 02/12/2018: She feels her MS is stable.   She is on Gilenya and tolerates it well.   Also the  lymphocyte count was 0.6.    I personally reviewed the 08/22/2017 MRI  of the brain and it is unchanged compared to the 2017 MRI.    Gait and strength are fine.    She notes some foot numbness, worse when tired.   The hand numbness resolved.   Bladder is fine.    She has fatigue but the fatigue does not prevent her from doing what she needs to do.   She has more stress at work due to staffing changes.   She sleeps ok most nights.   She has mild OSA.   Migraines are doing worse.   She has had more lately.      She often does worse during stormy weather.    The Migranal spray has no helped as much lately.     The Imitrex helped but it caused her to get cold fingers x a day.    Relpax made her dizzy.    She averages 2-3 migraines a month.    A higher dose of Topamax had not helped.         Update 08/12/2017: She reports that her MS has been stable.  She is on Gilenya.  She tolerates it well and there have been no exacerbations.  Neurologically she feels she is doing well.  She  has no major problems with gait, strength or sensation.  When tired, she notes mild fatigue.   She had one fall when she slipped but feels it is not related to MS.   Bladder function is fine.  Vision is fine.   She does worse when overheated.     She continues to have a lot of fatigue.  She stopped the phentermine because it made her feel generally.  She has mild OSA.  Mood is doing about the same.  She notes less stress with her mom who has Alzheimer's as she is now in a good memory unit nursing home.  .    She continues to report a lot of of migraine headaches.  Changes in weather often trigger a headache.  She has 1-2 HA's a month but they are very severe and she needs to leave work.   Topamax has helped some.  Migranal also helps and it works better than triptan's but she needs to take it early for any effect.      Update 02/12/2017:   She feels that her MS is stable. Specifically she has not had any exacerbations just in gait, strength or sensation. She  can walk in high heels.  She remains on Gilenya and has tolerated it well.   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   CBC with diff, TSH and CMP drawn 01/30/2017 and labs are fine. The MRI of the brain 05/09/2016 did not show any acute findings.     Vision is doing well.    Her bladder function is good.      Her main problem has been fatigue.    After the last visit we also checked a home sleep study. She had mild OSA with AHI = 12.8.     She actually feels fatigue and sleepiness are better since the ;ast visit as she is trying to get more sleep.     Mood si doing ok but she has stress with her mother's health issues (dementia).    Migraines are doing better on Topamax.   Now she only gets migraines if changes in weather or if she eats onions.   Migranal helps with most migraines and was better than sumatriptan.       ________________________________ From 08/08/2016: MS History:   She  was diagnosed with MS in 2012 after presenting with pain and numbness in the right arm and blurry vision. Her ophthalmologist diagnosed her with nystagmus. She was referred to Dr. Starleen Blue in advance and was diagnosed with MS after an MRI. Combination of her symptoms and classic MRI were sufficient to diagnose MS without the need to undergo a lumbar puncture. She received 3 days of IV Solu-Medrol and was back to baseline in 3 weeks..  Initially, she was placed on Betaseron. She had a rash with that and was switched to Tysabri. After 42 infusions, she switched due to a high JCV antibody titer (0.66).  Since February 2017 she has been on Gilenya. She tolerates it well and has not had any exacerbations.    Her last MRI of the brain was performed December 2017. Personally reviewed the images. She she has multiple T2/FLAIR hyperintense foci, predominantly in the periventricular white matter. Most are radially oriented to the ventricles and some are hypointense on T1-weighted images. None of the foci enhanced. There were no acute findings.    Gait/strength/sensation: She feels that her gait is good and she does not report difficulties with her balance. She does not note any weakness in general. However, when she gets hot or tired the right leg will be slightly weak and also sometimes she will have more spasticity in that leg. She does sometimes note some tingling or pain in the right arm and leg, usually when she is either hot or tired.  Vision: She denies any current issue with her visual acuity. There is no double vision. There is no nystagmus. There is no eye pain.  Bladder/bowel: She denies any difficulty with urinary frequency, urgency or hesitancy. She does have 2 times nocturia. There is no incontinence. She denies any bowel issues.   Fatigue/sleep:      She notes fatigue that is more physical and cognitive. Fatigue is worse when she is hot. She sleeps 6-6-1/2 hours a night but does not always feel refreshed  when she wakes up. She will usually wake up twice a night to use the bathroom but will fall back asleep. She has been noted to snore. There is some irregularity in her breathing and her husband describes possible sleep apnea.  Mood/cognition: She denies any depression but does note some anxiety. She has  some stress taking care of her mother who is in assisted living. She denies any significant issues with cognitive function. Specifically, there is no problems with memory, verbal fluency or executive function.   EPWORTH SLEEPINESS SCALE  On a scale of 0 - 3 what is the chance of dozing:  Sitting and Reading:   2 Watching TV:    3 Sitting inactive in a public place: 0 Passenger in car for one hour: 0 Lying down to rest in the afternoon: 3 Sitting and talking to someone: 0 Sitting quietly after lunch:  0 In a car, stopped in traffic:  0  Total (out of 24):      8/24    Migraine headaches. She has common migraine headaches with throbbing pain that occurs a few times a month. She has done much better on Topamax 25 mg in the morning and 50 and night.    She takes a Migranal if a a migraine occurs.   REVIEW OF SYSTEMS: Constitutional: No fevers, chills, sweats, or change in appetite.   She reports fatigue.   Has gained a few pounds over the past year.   Eyes: No visual changes, double vision, eye pain Ear, nose and throat: No hearing loss, ear pain, nasal congestion, sore throat Cardiovascular: No chest pain.  She has had palpitations Respiratory: No shortness of breath at rest or with exertion.   No wheezes.   Snores, possible OSA. GastrointestinaI: No nausea, vomiting, diarrhea, abdominal pain, fecal incontinence Genitourinary: No dysuria, urinary retention or frequency.  2 x  nocturia. Musculoskeletal: No neck pain, back pain Integumentary: No rash, pruritus, skin lesions Neurological: as above Psychiatric: No depression at this time.  No anxiety Endocrine: She sees endocrinology  for a goiter and subclinical hyperthyroidism (Dr. Tawnya Crook).  No diaphoresis, change in appetite, change in weigh or increased thirst Hematologic/Lymphatic: No anemia, purpura, petechiae. Allergic/Immunologic: No itchy/runny eyes, nasal congestion, recent allergic reactions, rashes  ALLERGIES: Allergies  Allergen Reactions  . Benadryl [Diphenhydramine Hcl]   . Betaseron [Interferon Beta-1b] Rash    HOME MEDICATIONS:  Current Outpatient Medications:  .  Ascorbic Acid (VITAMIN C) 1000 MG tablet, Take by mouth., Disp: , Rfl:  .  dihydroergotamine (MIGRANAL) 4 MG/ML nasal spray, Place 1 spray into the nose as needed for migraine., Disp: 12 mL, Rfl: 11 .  GILENYA 0.5 MG CAPS, TAKE ONE CAPSULE (0.5 MG) BY MOUTH ONCE DAILY. MAY TAKE WITH OR WITHOUT FOOD. STORE AT ROOM TEMPERATURE., Disp: 90 capsule, Rfl: 2 .  ibuprofen (ADVIL,MOTRIN) 200 MG tablet, Take by mouth., Disp: , Rfl:  .  Lactobacillus Rhamnosus, GG, (CULTURELLE PO), Take by mouth 2 (two) times daily., Disp: , Rfl:  .  Lysine 500 MG TABS, Take by mouth., Disp: , Rfl:  .  Magnesium Gluconate 550 MG TABS, Take 400 mg by mouth., Disp: , Rfl:  .  methimazole (TAPAZOLE) 10 MG tablet, Take 10 mg by mouth daily., Disp: , Rfl:  .  ranitidine (ZANTAC) 150 MG tablet, Take 150 mg by mouth., Disp: , Rfl:  .  topiramate (TOPAMAX) 25 MG tablet, Take one tablet in the morning and 3 tablets at night, Disp: 360 tablet, Rfl: 3 .  Turmeric 500 MG TABS, Take by mouth., Disp: , Rfl:  .  VITAMIN D, ERGOCALCIFEROL, PO, Take 5,000 Units by mouth., Disp: , Rfl:  .  SUMAtriptan (IMITREX) 50 MG tablet, Take 1 tablet (50 mg total) by mouth every 2 (two) hours as needed for migraine. May repeat in 2  hours if headache persists or recurs., Disp: 30 tablet, Rfl: 3  PAST MEDICAL HISTORY: Past Medical History:  Diagnosis Date  . Anxiety   . Bell's palsy   . Hypertension   . Migraine   . MS (multiple sclerosis) ([place])   . Seizures ([place])   . Thyroid disease   .  Vision abnormalities     PAST SURGICAL HISTORY: Past Surgical History:  Procedure Laterality Date  . ENDOMETRIAL ABLATION    . TUBAL LIGATION      FAMILY HISTORY: Family History  Problem Relation Age of Onset  . Dementia Mother   . Osteoarthritis Mother   . Transient ischemic attack Mother   . Hypertension Mother   . Heart disease Father   . Hypertension Father   . Diabetes Mellitus II Father   . Migraines Sister   . Heart attack Brother   . Colon cancer Brother   . Diverticulitis Brother   . Healthy Brother   . Tremor Brother   . Healthy Brother     SOCIAL HISTORY:  Social History   Socioeconomic History  . Marital status: Married    Spouse name: Not on file  . Number of children: Not on file  . Years of education: Not on file  . Highest education level: Not on file  Occupational History  . Not on file  Social Needs  . Financial resource strain: Not on file  . Food insecurity:    Worry: Not on file    Inability: Not on file  . Transportation needs:    Medical: Not on file    Non-medical: Not on file  Tobacco Use  . Smoking status: Never Smoker  . Smokeless tobacco: Never Used  Substance and Sexual Activity  . Alcohol use: No  . Drug use: Not on file  . Sexual activity: Not on file  Lifestyle  . Physical activity:    Days per week: Not on file    Minutes per session: Not on file  . Stress: Not on file  Relationships  . Social connections:    Talks on phone: Not on file    Gets together: Not on file    Attends religious service: Not on file    Active member of club or organization: Not on file    Attends meetings of clubs or organizations: Not on file    Relationship status: Not on file  . Intimate partner violence:    Fear of current or ex partner: Not on file    Emotionally abused: Not on file    Physically abused: Not on file    Forced sexual activity: Not on file  Other Topics Concern  . Not on file  Social History Narrative  . Not on  file     PHYSICAL EXAM  Vitals:   02/12/18 0825  BP: 140/89  Pulse: 76  Resp: 18  Weight: 244 lb (110.7 kg)  Height: 5\' 4"  (1.626 m)    Body mass index is 41.88 kg/m.   General: The patient is well-developed and well-nourished and in no acute distress.   Neck is nontender with good ROM   Neurologic Exam  Mental status: The patient is alert and oriented x 3 at the time of the examination. The patient has apparent normal recent and remote memory, with an apparently normal attention span and concentration ability.   Speech is normal.  Cranial nerves: Extraocular movements are full. Facial strength and sensation are normal.   Trapezius strength is  normal.    The tongue is midline, and the patient has symmetric elevation of the soft palate. No obvious hearing deficits are noted.  Motor:  Muscle bulk is normal.   Tone is normal in the arms but mildly increased in the right leg. .   Strength is  5 / 5 in all 4 extremities.   Sensory: She has normal touch sensation in limbs  Coordination: Cerebellar testing shows good Finger nose finger  Gait and station: Station is normal.   The gait is normal. The tandem gait is mildly wide but she is wearing low heels. . Romberg is negative..   Reflexes: Deep tendon reflexes are symmetric and normal bilaterally.        DIAGNOSTIC DATA (LABS, IMAGING, TESTING) - I reviewed patient records, labs, notes, testing and imaging myself where available.      ASSESSMENT AND PLAN  MS (multiple sclerosis) ([place]) - Plan: CBC with Differential/Platelet  Chronic fatigue  Migraine without aura and without status migrainosus, not intractable  High risk medication use   1.  Continue Gilenya.  Check CBC with differential today to make sure she does not have very severe lymphopenia.    2. Continue topiramate to 25 mg in am and 75 mg at night for migraines.   Re-try Imitrex.   If not tolerated, then consider IM toradol or po indomethacin 3.   She will  return to see me in 6 months but sooner if she has any new or worsening neurologic symptoms    Bronsyn Shappell A. Felecia Shelling, MD, PhD 08/07/2332, 3:56 AM Certified in Neurology, Clinical Neurophysiology, Sleep Medicine, Pain Medicine and Neuroimaging  [facility] [address] [place], [place] [address] [phone]

## 2018-02-13 LAB — CBC WITH DIFFERENTIAL/PLATELET
BASOS: 1 %
Basophils Absolute: 0 10*3/uL (ref 0.0–0.2)
EOS (ABSOLUTE): 0 10*3/uL (ref 0.0–0.4)
EOS: 1 %
HEMATOCRIT: 42.7 % (ref 34.0–46.6)
HEMOGLOBIN: 13.5 g/dL (ref 11.1–15.9)
IMMATURE GRANULOCYTES: 1 %
Immature Grans (Abs): 0 10*3/uL (ref 0.0–0.1)
Lymphocytes Absolute: 0.2 10*3/uL — ABNORMAL LOW (ref 0.7–3.1)
Lymphs: 6 %
MCH: 29 pg (ref 26.6–33.0)
MCHC: 31.6 g/dL (ref 31.5–35.7)
MCV: 92 fL (ref 79–97)
MONOCYTES: 16 %
MONOS ABS: 0.6 10*3/uL (ref 0.1–0.9)
NEUTROS PCT: 75 %
Neutrophils Absolute: 2.7 10*3/uL (ref 1.4–7.0)
Platelets: 249 10*3/uL (ref 150–450)
RBC: 4.66 x10E6/uL (ref 3.77–5.28)
RDW: 13.5 % (ref 12.3–15.4)
WBC: 3.6 10*3/uL (ref 3.4–10.8)

## 2018-02-18 ENCOUNTER — Telehealth: Payer: Self-pay | Admitting: *Deleted

## 2018-02-18 NOTE — Telephone Encounter (Signed)
-----   Message from Britt Bottom, MD sent at 02/17/2018  9:38 PM EDT ----- The lymphocte count is lower this time than the last 2 times.   I would like to recheck in 2 months to make sure it is not dropping further.

## 2018-02-18 NOTE — Telephone Encounter (Signed)
Spoke with Freda Munro and reviewed below lab results and need to repeat CBC with diff in 2 mos.  She verbalized understanding of same.  I will call her in 2 mos. to remind her to come in for repeat labs/fim

## 2018-06-23 ENCOUNTER — Encounter: Payer: Self-pay | Admitting: *Deleted

## 2018-06-23 ENCOUNTER — Telehealth: Payer: Self-pay | Admitting: Neurology

## 2018-06-23 NOTE — Telephone Encounter (Signed)
Pt is wanting to purchase home exercise equipment. [employer] is wanting to help her but she needs script stating she needs the equipment. Please call to advise

## 2018-06-23 NOTE — Telephone Encounter (Addendum)
Spoke with Dr. Felecia Shelling- okay to write letter. Letter printed, waiting on MD signature

## 2018-06-23 NOTE — Telephone Encounter (Signed)
I called pt. She will pick up letter but would like addendum to have recommendation for specific equipment: [employer] freestep recumbent cross trainer and that it would be safe for her to use. She will pick up letter tomorrow.

## 2018-06-23 NOTE — Telephone Encounter (Signed)
Letter addended, signed and placed up front for pick up

## 2018-08-13 ENCOUNTER — Telehealth: Payer: Self-pay | Admitting: *Deleted

## 2018-08-13 NOTE — Telephone Encounter (Signed)
Noted, I placed note in appt note

## 2018-08-13 NOTE — Telephone Encounter (Signed)
Patient called back in and stated that she is ok with telephone visit

## 2018-08-13 NOTE — Telephone Encounter (Signed)
Called, LVM for pt asking her to call back about appt tomorrow.  Advised that due to current COVID-19 pandemic, our office is severely reducing in office visits for at least the next 2 weeks, in order to minimize the risk to our patients and healthcare providers.  Was going to offer either telephone visit or video visit. If video visit, we will need to get her set up.

## 2018-08-14 ENCOUNTER — Encounter: Payer: Self-pay | Admitting: Neurology

## 2018-08-14 ENCOUNTER — Other Ambulatory Visit: Payer: Self-pay | Admitting: Neurology

## 2018-08-14 ENCOUNTER — Ambulatory Visit (INDEPENDENT_AMBULATORY_CARE_PROVIDER_SITE_OTHER): Payer: PRIVATE HEALTH INSURANCE | Admitting: Neurology

## 2018-08-14 ENCOUNTER — Other Ambulatory Visit: Payer: Self-pay

## 2018-08-14 DIAGNOSIS — G35 Multiple sclerosis: Secondary | ICD-10-CM

## 2018-08-14 DIAGNOSIS — Z79899 Other long term (current) drug therapy: Secondary | ICD-10-CM | POA: Diagnosis not present

## 2018-08-14 DIAGNOSIS — G43009 Migraine without aura, not intractable, without status migrainosus: Secondary | ICD-10-CM

## 2018-08-14 DIAGNOSIS — G4733 Obstructive sleep apnea (adult) (pediatric): Secondary | ICD-10-CM | POA: Diagnosis not present

## 2018-08-14 MED ORDER — FINGOLIMOD HCL 0.5 MG PO CAPS: ORAL_CAPSULE | ORAL | 3 refills | Status: DC

## 2018-08-14 MED ORDER — SUMATRIPTAN SUCCINATE 50 MG PO TABS: 50.0000 mg | ORAL_TABLET | ORAL | 3 refills | Status: DC | PRN

## 2018-08-14 MED ORDER — TOPIRAMATE 25 MG PO TABS: ORAL_TABLET | ORAL | 3 refills | Status: DC

## 2018-08-14 NOTE — Progress Notes (Signed)
[facility]  PATIENT: Shelly Boyd DOB: 1961/03/14  REFERRING DOCTOR OR PCP:  Dr. Maye Hides, PCP is [name] SOURCE: patient, notes rom Dr. Starleen Blue, lab/imaging results, MRI images on CD/PACS  _________________________________   HISTORICAL  HISTORY OF PRESENT ILLNESS:  Shelly Boyd is a 58 y.o. woman who was diagnosed with MS in 2012  Update 08/14/2018: Virtual Visit via Video Note  I connected with Shelly Boyd on 08/14/18 at  1:00 PM EDT by a video enabled telemedicine application and verified that I am speaking with the correct person using two identifiers.   I discussed the limitations of evaluation and management by telemedicine and the availability of in person appointments. The patient expressed understanding and agreed to proceed.  History of Present Illness: She feels that her MS has been stable and she has no new symptoms or exacerbations.   She is now exercising 15 minutes 1/2 the days.    She is on Gilenya and is tolerating it well.   6 months ago the lymphocyte count was 0.2.  We discussed going to every other day Gilenya.  Her MS has done well.  She notes no difficulties with her gait, balance (can wear heels), vision or bladder.  However, she did have 2 UTIs since the last visit.  She notes mild fatigue that does not get in the way of doing her tasks.  She is sleeping well.  She works full-time and has no difficulty with cognition.  She has mild OSA but does not have a problem with sleepiness.  She has migraines, helped by Imitrex (just 1/2 pill).   It does cause some flushing.  She is on Topiramate  Phentermine made her jittery.  She was only on it for short period time.  Despite exercise and Topamax, she has not been able to lose weight.    Observations/Objective: She is a well-developed well-nourished woman in no acute distress.  The head is normocephalic and atraumatic.  No rashes were seen.  She is alert and fully oriented with fluent  speech and good attention, knowledge and memory.  Extraocular muscles were intact.  Facial strength was normal.  Arm strength appeared normal.  Rapid rotating movements and finger-to-nose was performed well.  Station was normal.  The gait and tandem gait appear normal.  Assessment and Plan: MS (multiple sclerosis) ([place])  Migraine without aura and without status migrainosus, not intractable  High risk medication use  OSA (obstructive sleep apnea)  1.   We discussed reducing the Gilenya to every other day as that will allow the lymphocyte count to increase some which may be of benefit if she does catch the coronavirus.   Later this year we will check an MRI of the brain with and without contrast to determine if there is any subclinical activity. 2.    Migraines are doing well.  I will refill her other medications. 3.    She will return to see me in 6 months or sooner if there are new or worsening neurologic symptoms.  Follow Up Instructions:  I discussed the assessment and treatment plan with the patient. The patient was provided an opportunity to ask questions and all were answered. The patient agreed with the plan and demonstrated an understanding of the instructions.   The patient was advised to call back or seek an in-person evaluation if the symptoms worsen or if the condition fails to improve as anticipated.  I provided 20 minutes of non-face-to-face time during this encounter.  Richard A. Felecia Shelling, MD, PhD, FAAN Certified in Neurology, Clinical Neurophysiology, Sleep Medicine, Pain Medicine and Neuroimaging Director, [facility] [address] [place], [place] [address] [phone]    _________________________________________________________________________ Update 02/12/2018: She feels her MS is stable.   She is on Gilenya and tolerates it well.   Also the  lymphocyte count was 0.6.    I  personally reviewed the 08/22/2017 MRI  of the brain and it is unchanged compared to the 2017 MRI.    Gait and strength are fine.    She notes some foot numbness, worse when tired.   The hand numbness resolved.   Bladder is fine.    She has fatigue but the fatigue does not prevent her from doing what she needs to do.   She has more stress at work due to staffing changes.   She sleeps ok most nights.   She has mild OSA.   Migraines are doing worse.   She has had more lately.      She often does worse during stormy weather.    The Migranal spray has no helped as much lately.     The Imitrex helped but it caused her to get cold fingers x a day.    Relpax made her dizzy.    She averages 2-3 migraines a month.    A higher dose of Topamax had not helped.         Update 08/12/2017: She reports that her MS has been stable.  She is on Gilenya.  She tolerates it well and there have been no exacerbations.  Neurologically she feels she is doing well.  She has no major problems with gait, strength or sensation.  When tired, she notes mild fatigue.   She had one fall when she slipped but feels it is not related to MS.   Bladder function is fine.  Vision is fine.   She does worse when overheated.     She continues to have a lot of fatigue.  She stopped the phentermine because it made her feel generally.  She has mild OSA.  Mood is doing about the same.  She notes less stress with her mom who has Alzheimer's as she is now in a good memory unit nursing home.  .    She continues to report a lot of of migraine headaches.  Changes in weather often trigger a headache.  She has 1-2 HA's a month but they are very severe and she needs to leave work.   Topamax has helped some.  Migranal also helps and it works better than triptan's but she needs to take it early for any effect.      Update 02/12/2017:   She feels that her MS is stable. Specifically she has not had any exacerbations just in gait, strength or sensation. She can walk  in high heels.  She remains on Gilenya and has tolerated it well.   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   CBC with diff, TSH and CMP drawn 01/30/2017 and labs are fine. The MRI of the brain 05/09/2016 did not show any acute findings.     Vision is doing well.    Her bladder function is good.      Her main problem has been fatigue.    After the last visit we also checked a home sleep study. She  had mild OSA with AHI = 12.8.     She actually feels fatigue and sleepiness are better since the ;ast visit as she is trying to get more sleep.     Mood si doing ok but she has stress with her mother's health issues (dementia).    Migraines are doing better on Topamax.   Now she only gets migraines if changes in weather or if she eats onions.   Migranal helps with most migraines and was better than sumatriptan.       ________________________________ From 08/08/2016: MS History:   She was diagnosed with MS in 2012 after presenting with pain and numbness in the right arm and blurry vision. Her ophthalmologist diagnosed her with nystagmus. She was referred to Dr. Starleen Blue in advance and was diagnosed with MS after an MRI. Combination of her symptoms and classic MRI were sufficient to diagnose MS without the need to undergo a lumbar puncture. She received 3 days of IV Solu-Medrol and was back to baseline in 3 weeks..  Initially, she was placed on Betaseron. She had a rash with that and was switched to Tysabri. After 42 infusions, she switched due to a high JCV antibody titer (0.66).  Since February 2017 she has been on Gilenya. She tolerates it well and has not had any exacerbations.    Her last MRI of the brain was performed December 2017. Personally reviewed the images. She she has multiple T2/FLAIR hyperintense foci, predominantly in the periventricular white matter. Most are radially oriented to the ventricles and some are hypointense on T1-weighted images.  None of the foci enhanced. There were no acute findings.    Gait/strength/sensation: She feels that her gait is good and she does not report difficulties with her balance. She does not note any weakness in general. However, when she gets hot or tired the right leg will be slightly weak and also sometimes she will have more spasticity in that leg. She does sometimes note some tingling or pain in the right arm and leg, usually when she is either hot or tired.  Vision: She denies any current issue with her visual acuity. There is no double vision. There is no nystagmus. There is no eye pain.  Bladder/bowel: She denies any difficulty with urinary frequency, urgency or hesitancy. She does have 2 times nocturia. There is no incontinence. She denies any bowel issues.   Fatigue/sleep:      She notes fatigue that is more physical and cognitive. Fatigue is worse when she is hot. She sleeps 6-6-1/2 hours a night but does not always feel refreshed when she wakes up. She will usually wake up twice a night to use the bathroom but will fall back asleep. She has been noted to snore. There is some irregularity in her breathing and her husband describes possible sleep apnea.  Mood/cognition: She denies any depression but does note some anxiety. She has some stress taking care of her mother who is in assisted living. She denies any significant issues with cognitive function. Specifically, there is no problems with memory, verbal fluency or executive function.   EPWORTH SLEEPINESS SCALE  On a scale of 0 - 3 what is the chance of dozing:  Sitting and Reading:   2 Watching TV:    3 Sitting inactive in a public place: 0 Passenger in car for one hour: 0 Lying down to rest in the afternoon: 3 Sitting and talking to someone: 0 Sitting quietly after lunch:  0 In a car, stopped  in traffic:  0  Total (out of 24):      8/24    Migraine headaches. She has common migraine headaches with throbbing pain that occurs a few  times a month. She has done much better on Topamax 25 mg in the morning and 50 and night.    She takes a Migranal if a a migraine occurs.   REVIEW OF SYSTEMS: Constitutional: No fevers, chills, sweats, or change in appetite.   She reports fatigue.   Has gained a few pounds over the past year.   Eyes: No visual changes, double vision, eye pain Ear, nose and throat: No hearing loss, ear pain, nasal congestion, sore throat Cardiovascular: No chest pain.  She has had palpitations Respiratory: No shortness of breath at rest or with exertion.   No wheezes.   Snores, possible OSA. GastrointestinaI: No nausea, vomiting, diarrhea, abdominal pain, fecal incontinence Genitourinary: No dysuria, urinary retention or frequency.  2 x  nocturia. Musculoskeletal: No neck pain, back pain Integumentary: No rash, pruritus, skin lesions Neurological: as above Psychiatric: No depression at this time.  No anxiety Endocrine: She sees endocrinology for a goiter and subclinical hyperthyroidism (Dr. Tawnya Crook).  No diaphoresis, change in appetite, change in weigh or increased thirst Hematologic/Lymphatic: No anemia, purpura, petechiae. Allergic/Immunologic: No itchy/runny eyes, nasal congestion, recent allergic reactions, rashes  ALLERGIES: Allergies  Allergen Reactions  . Benadryl [Diphenhydramine Hcl]   . Betaseron [Interferon Beta-1b] Rash    HOME MEDICATIONS:  Current Outpatient Medications:  .  Ascorbic Acid (VITAMIN C) 1000 MG tablet, Take by mouth., Disp: , Rfl:  .  dihydroergotamine (MIGRANAL) 4 MG/ML nasal spray, Place 1 spray into the nose as needed for migraine., Disp: 12 mL, Rfl: 11 .  GILENYA 0.5 MG CAPS, TAKE ONE CAPSULE (0.5 MG) BY MOUTH ONCE DAILY. MAY TAKE WITH OR WITHOUT FOOD. STORE AT ROOM TEMPERATURE., Disp: 90 capsule, Rfl: 1 .  ibuprofen (ADVIL,MOTRIN) 200 MG tablet, Take by mouth., Disp: , Rfl:  .  Lactobacillus Rhamnosus, GG, (CULTURELLE PO), Take by mouth 2 (two) times daily., Disp: ,  Rfl:  .  Lysine 500 MG TABS, Take by mouth., Disp: , Rfl:  .  Magnesium Gluconate 550 MG TABS, Take 400 mg by mouth., Disp: , Rfl:  .  methimazole (TAPAZOLE) 10 MG tablet, Take 10 mg by mouth daily., Disp: , Rfl:  .  ranitidine (ZANTAC) 150 MG tablet, Take 150 mg by mouth., Disp: , Rfl:  .  SUMAtriptan (IMITREX) 50 MG tablet, Take 1 tablet (50 mg total) by mouth every 2 (two) hours as needed for migraine. May repeat in 2 hours if headache persists or recurs., Disp: 30 tablet, Rfl: 3 .  topiramate (TOPAMAX) 25 MG tablet, Take one tablet in the morning and 3 tablets at night, Disp: 360 tablet, Rfl: 3 .  Turmeric 500 MG TABS, Take by mouth., Disp: , Rfl:  .  VITAMIN D, ERGOCALCIFEROL, PO, Take 5,000 Units by mouth., Disp: , Rfl:   PAST MEDICAL HISTORY: Past Medical History:  Diagnosis Date  . Anxiety   . Bell's palsy   . Hypertension   . Migraine   . MS (multiple sclerosis) ([place])   . Seizures ([place])   . Thyroid disease   . Vision abnormalities     PAST SURGICAL HISTORY: Past Surgical History:  Procedure Laterality Date  . ENDOMETRIAL ABLATION    . TUBAL LIGATION      FAMILY HISTORY: Family History  Problem Relation Age of Onset  . Dementia  Mother   . Osteoarthritis Mother   . Transient ischemic attack Mother   . Hypertension Mother   . Heart disease Father   . Hypertension Father   . Diabetes Mellitus II Father   . Migraines Sister   . Heart attack Brother   . Colon cancer Brother   . Diverticulitis Brother   . Healthy Brother   . Tremor Brother   . Healthy Brother     SOCIAL HISTORY:  Social History   Socioeconomic History  . Marital status: Married    Spouse name: Not on file  . Number of children: Not on file  . Years of education: Not on file  . Highest education level: Not on file  Occupational History  . Not on file  Social Needs  . Financial resource strain: Not on file  . Food insecurity:    Worry: Not on file    Inability: Not on file  .  Transportation needs:    Medical: Not on file    Non-medical: Not on file  Tobacco Use  . Smoking status: Never Smoker  . Smokeless tobacco: Never Used  Substance and Sexual Activity  . Alcohol use: No  . Drug use: Not on file  . Sexual activity: Not on file  Lifestyle  . Physical activity:    Days per week: Not on file    Minutes per session: Not on file  . Stress: Not on file  Relationships  . Social connections:    Talks on phone: Not on file    Gets together: Not on file    Attends religious service: Not on file    Active member of club or organization: Not on file    Attends meetings of clubs or organizations: Not on file    Relationship status: Not on file  . Intimate partner violence:    Fear of current or ex partner: Not on file    Emotionally abused: Not on file    Physically abused: Not on file    Forced sexual activity: Not on file  Other Topics Concern  . Not on file  Social History Narrative  . Not on file     PHYSICAL EXAM  There were no vitals filed for this visit.  There is no height or weight on file to calculate BMI.   General: The patient is well-developed and well-nourished and in no acute distress.   Neck is nontender with good ROM   Neurologic Exam  Mental status: The patient is alert and oriented x 3 at the time of the examination. The patient has apparent normal recent and remote memory, with an apparently normal attention span and concentration ability.   Speech is normal.  Cranial nerves: Extraocular movements are full. Facial strength and sensation are normal.   Trapezius strength is normal.    The tongue is midline, and the patient has symmetric elevation of the soft palate. No obvious hearing deficits are noted.  Motor:  Muscle bulk is normal.   Tone is normal in the arms but mildly increased in the right leg. .   Strength is  5 / 5 in all 4 extremities.   Sensory: She has normal touch sensation in limbs  Coordination: Cerebellar  testing shows good Finger nose finger  Gait and station: Station is normal.   The gait is normal. The tandem gait is mildly wide but she is wearing low heels. . Romberg is negative..   Reflexes: Deep tendon reflexes are symmetric and  normal bilaterally.

## 2018-08-24 ENCOUNTER — Other Ambulatory Visit: Payer: Self-pay | Admitting: Neurology

## 2018-10-28 ENCOUNTER — Telehealth: Payer: Self-pay | Admitting: Neurology

## 2018-10-28 DIAGNOSIS — G35 Multiple sclerosis: Secondary | ICD-10-CM

## 2018-10-28 MED ORDER — FINGOLIMOD HCL 0.5 MG PO CAPS: ORAL_CAPSULE | ORAL | 3 refills | Status: DC

## 2018-10-28 NOTE — Telephone Encounter (Signed)
E-escribed rx to [facility] as requested.

## 2018-10-28 NOTE — Telephone Encounter (Signed)
Pt is needing her Fingolimod HCl (GILENYA) 0.5 MG CAPS sent to [facility] due to the insurance that she has now. New insurance information has been entered to the chart for [place]. Please advise.

## 2018-10-31 DIAGNOSIS — G35 Multiple sclerosis: Secondary | ICD-10-CM

## 2018-11-07 NOTE — Telephone Encounter (Signed)
Pt has called to inform RN that she has about 15 days left on her Fingolimod HCl (GILENYA) 0.5 MG CAPS .  Pt needs to remain on the co pay assistance program.  Pt wants RN to know she has not heard from anyone re: her refill

## 2018-11-10 ENCOUNTER — Telehealth: Payer: Self-pay | Admitting: Neurology

## 2018-11-10 NOTE — Telephone Encounter (Signed)
no to the covid-19 questions MR Brain w/wo contrast Dr. Cheree Ditto Auth: [id number] (exp. 11/06/18 to 05/05/19). Patient is scheduled at [facility] for 11/25/18.

## 2018-11-10 NOTE — Telephone Encounter (Addendum)
I reached out to [facility] and spoke with rep [employer]. He stated the rx that was sent on 10/28/18 needed to reflect a 30 day supply instead of a 90 day supply. I was able to speak with the pharmacist and provided a 30 day supply refill. I contacted the pt on her mobile # and was able to advise upon this as well. She verbalized understanding.

## 2018-11-25 ENCOUNTER — Other Ambulatory Visit: Payer: Self-pay

## 2018-11-25 ENCOUNTER — Ambulatory Visit (INDEPENDENT_AMBULATORY_CARE_PROVIDER_SITE_OTHER): Payer: BC Managed Care – PPO

## 2018-11-25 DIAGNOSIS — G35 Multiple sclerosis: Secondary | ICD-10-CM | POA: Diagnosis not present

## 2018-11-25 MED ORDER — GADOBENATE DIMEGLUMINE 529 MG/ML IV SOLN
20.0000 mL | Freq: Once | INTRAVENOUS | Status: AC | PRN
  Administered 2018-11-25: 10:00:00 20 mL via INTRAVENOUS

## 2018-11-26 ENCOUNTER — Telehealth: Payer: Self-pay | Admitting: *Deleted

## 2018-11-26 NOTE — Telephone Encounter (Signed)
-----   Message from Britt Bottom, MD sent at 11/25/2018  5:12 PM EDT ----- Please let the patient know that the MRI looks good -- no new MS lesions

## 2019-08-03 ENCOUNTER — Other Ambulatory Visit: Payer: Self-pay | Admitting: Neurology

## 2019-08-25 ENCOUNTER — Other Ambulatory Visit: Payer: Self-pay | Admitting: Neurology

## 2019-09-22 ENCOUNTER — Encounter: Payer: Self-pay | Admitting: Neurology

## 2019-09-22 ENCOUNTER — Other Ambulatory Visit: Payer: Self-pay

## 2019-09-22 ENCOUNTER — Ambulatory Visit (INDEPENDENT_AMBULATORY_CARE_PROVIDER_SITE_OTHER): Payer: BC Managed Care – PPO | Admitting: Neurology

## 2019-09-22 VITALS — BP 150/98 | HR 89 | Temp 96.8°F | Ht 64.0 in | Wt 240.0 lb

## 2019-09-22 DIAGNOSIS — G35 Multiple sclerosis: Secondary | ICD-10-CM | POA: Diagnosis not present

## 2019-09-22 DIAGNOSIS — Z79899 Other long term (current) drug therapy: Secondary | ICD-10-CM | POA: Diagnosis not present

## 2019-09-22 DIAGNOSIS — R269 Unspecified abnormalities of gait and mobility: Secondary | ICD-10-CM | POA: Diagnosis not present

## 2019-09-22 DIAGNOSIS — G43009 Migraine without aura, not intractable, without status migrainosus: Secondary | ICD-10-CM | POA: Diagnosis not present

## 2019-09-22 NOTE — Progress Notes (Signed)
[facility]  PATIENT: Shelly Boyd DOB: 16-Oct-1960  REFERRING DOCTOR OR PCP:  Dr. Maye Hides, PCP is [name] SOURCE: patient, notes rom Dr. Starleen Blue, lab/imaging results, MRI images on CD/PACS  _________________________________   HISTORICAL  HISTORY OF PRESENT ILLNESS:  Shelly Boyd is a 59 y.o. woman who was diagnosed with relapsing remitting MS in 2012  Update 09/22/2019: She caught Covid-19 in January and was very tired but did not have any respiratory issues.   She is actually exercising more and now feeling better.   She feels she is walking well and can go 3 miles without stopping.   She denies muscle spasticity.   She denies numbness.   She had urinary hesitancy/reduced emptying.   She saw urology and did PT.   She has only recent UTI.  Vision is fine.  She wears bifocal glasses.     She is on Gilenya and tolerates it well.   Her lymphocyte counts were 0.2, 0.2 and 0.3 over the past 7 months.   We discussed that low lymphocytes counts are common and related to mechanism.   If counts were 0.1 we would lower to qod.    She is noting more stress at work.   She denies depression or anxiety.   She sleeps well most nights.    Migraines are generally doing well.     Update 08/14/2018 (virtual): She feels that her MS has been stable and she has no new symptoms or exacerbations.   She is now exercising 15 minutes 1/2 the days.    She is on Gilenya and is tolerating it well.   6 months ago the lymphocyte count was 0.2.  We discussed going to every other day Gilenya.  Her MS has done well.  She notes no difficulties with her gait, balance (can wear heels), vision or bladder.  However, she did have 2 UTIs since the last visit.  She notes mild fatigue that does not get in the way of doing her tasks.  She is sleeping well.  She works full-time and has no difficulty with cognition.  She has mild OSA but does not have a problem with sleepiness.  She has migraines, helped  by Imitrex (just 1/2 pill).   It does cause some flushing.  She is on Topiramate  Phentermine made her jittery.  She was only on it for short period time.  Despite exercise and Topamax, she has not been able to lose weight.     1.   We discussed reducing the Gilenya to every other day as that will allow the lymphocyte count to increase some which may be of benefit if she does catch the coronavirus.   Later this year we will check an MRI of the brain with and without contrast to determine if there is any subclinical activity. 2.    Migraines are doing well.  I will refill her other medications. 3.    She will return to see me in 6 months or sooner if there are new or worsening neurologic symptoms.     Thierno Hun A. Felecia Shelling, MD, PhD, FAAN Certified in Neurology, Clinical Neurophysiology, Sleep Medicine, Pain Medicine and Neuroimaging Director, [place] [facility] [address] [place], [place] [address] [phone]    _________________________________________________________________________ Update 02/12/2018: She feels her MS is stable.   She is on Gilenya and tolerates it well.   Also the  lymphocyte count was 0.6.  I personally reviewed the 08/22/2017 MRI  of the brain and it is unchanged compared to the 2017 MRI.    Gait and strength are fine.    She notes some foot numbness, worse when tired.   The hand numbness resolved.   Bladder is fine.    She has fatigue but the fatigue does not prevent her from doing what she needs to do.   She has more stress at work due to staffing changes.   She sleeps ok most nights.   She has mild OSA.   Migraines are doing worse.   She has had more lately.      She often does worse during stormy weather.    The Migranal spray has no helped as much lately.     The Imitrex helped but it caused her to get cold fingers x a day.    Relpax made her dizzy.    She averages 2-3 migraines a month.     A higher dose of Topamax had not helped.         Update 08/12/2017: She reports that her MS has been stable.  She is on Gilenya.  She tolerates it well and there have been no exacerbations.  Neurologically she feels she is doing well.  She has no major problems with gait, strength or sensation.  When tired, she notes mild fatigue.   She had one fall when she slipped but feels it is not related to MS.   Bladder function is fine.  Vision is fine.   She does worse when overheated.     She continues to have a lot of fatigue.  She stopped the phentermine because it made her feel generally.  She has mild OSA.  Mood is doing about the same.  She notes less stress with her mom who has Alzheimer's as she is now in a good memory unit nursing home.  .    She continues to report a lot of of migraine headaches.  Changes in weather often trigger a headache.  She has 1-2 HA's a month but they are very severe and she needs to leave work.   Topamax has helped some.  Migranal also helps and it works better than triptan's but she needs to take it early for any effect.      Update 02/12/2017:   She feels that her MS is stable. Specifically she has not had any exacerbations just in gait, strength or sensation. She can walk in high heels.  She remains on Gilenya and has tolerated it well.   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   CBC with diff, TSH and CMP drawn 01/30/2017 and labs are fine. The MRI of the brain 05/09/2016 did not show any acute findings.     Vision is doing well.    Her bladder function is good.      Her main problem has been fatigue.    After the last visit we also checked a home sleep study. She had mild OSA with AHI = 12.8.     She actually feels fatigue and sleepiness are better since the ;ast visit as she is trying to get more sleep.     Mood si doing ok but she has stress with her mother's health issues (dementia).    Migraines are  doing better on Topamax.   Now she only gets migraines if changes in weather or if she eats  onions.   Migranal helps with most migraines and was better than sumatriptan.       ________________________________ From 08/08/2016: MS History:   She was diagnosed with MS in 2012 after presenting with pain and numbness in the right arm and blurry vision. Her ophthalmologist diagnosed her with nystagmus. She was referred to Dr. Starleen Blue in advance and was diagnosed with MS after an MRI. Combination of her symptoms and classic MRI were sufficient to diagnose MS without the need to undergo a lumbar puncture. She received 3 days of IV Solu-Medrol and was back to baseline in 3 weeks..  Initially, she was placed on Betaseron. She had a rash with that and was switched to Tysabri. After 42 infusions, she switched due to a high JCV antibody titer (0.66).  Since February 2017 she has been on Gilenya. She tolerates it well and has not had any exacerbations.    Her last MRI of the brain was performed December 2017. Personally reviewed the images. She she has multiple T2/FLAIR hyperintense foci, predominantly in the periventricular white matter. Most are radially oriented to the ventricles and some are hypointense on T1-weighted images. None of the foci enhanced. There were no acute findings.    Gait/strength/sensation: She feels that her gait is good and she does not report difficulties with her balance. She does not note any weakness in general. However, when she gets hot or tired the right leg will be slightly weak and also sometimes she will have more spasticity in that leg. She does sometimes note some tingling or pain in the right arm and leg, usually when she is either hot or tired.  Vision: She denies any current issue with her visual acuity. There is no double vision. There is no nystagmus. There is no eye pain.  Bladder/bowel: She denies any difficulty with urinary frequency, urgency or hesitancy. She does have 2  times nocturia. There is no incontinence. She denies any bowel issues.   Fatigue/sleep:      She notes fatigue that is more physical and cognitive. Fatigue is worse when she is hot. She sleeps 6-6-1/2 hours a night but does not always feel refreshed when she wakes up. She will usually wake up twice a night to use the bathroom but will fall back asleep. She has been noted to snore. There is some irregularity in her breathing and her husband describes possible sleep apnea.  Mood/cognition: She denies any depression but does note some anxiety. She has some stress taking care of her mother who is in assisted living. She denies any significant issues with cognitive function. Specifically, there is no problems with memory, verbal fluency or executive function.   EPWORTH SLEEPINESS SCALE  On a scale of 0 - 3 what is the chance of dozing:  Sitting and Reading:   2 Watching TV:    3 Sitting inactive in a public place: 0 Passenger in car for one hour: 0 Lying down to rest in the afternoon: 3 Sitting and talking to someone: 0 Sitting quietly after lunch:  0 In a car, stopped in traffic:  0  Total (out of 24):      8/24    Migraine headaches. She has common migraine headaches with throbbing pain that occurs a few times a month. She has done much better on Topamax 25 mg in the morning and 50 and night.    She takes a Migranal if a a migraine occurs.   REVIEW OF SYSTEMS: Constitutional: No fevers, chills, sweats,  or change in appetite.   She reports fatigue.   Has gained a few pounds over the past year.   Eyes: No visual changes, double vision, eye pain Ear, nose and throat: No hearing loss, ear pain, nasal congestion, sore throat Cardiovascular: No chest pain.  She has had palpitations Respiratory: No shortness of breath at rest or with exertion.   No wheezes.   Snores, possible OSA. GastrointestinaI: No nausea, vomiting, diarrhea, abdominal pain, fecal incontinence Genitourinary: No dysuria,  urinary retention or frequency.  2 x  nocturia. Musculoskeletal: No neck pain, back pain Integumentary: No rash, pruritus, skin lesions Neurological: as above Psychiatric: No depression at this time.  No anxiety Endocrine: She sees endocrinology for a goiter and subclinical hyperthyroidism (Dr. Tawnya Crook).  No diaphoresis, change in appetite, change in weigh or increased thirst Hematologic/Lymphatic: No anemia, purpura, petechiae. Allergic/Immunologic: No itchy/runny eyes, nasal congestion, recent allergic reactions, rashes  ALLERGIES: Allergies  Allergen Reactions  . Benadryl [Diphenhydramine Hcl]     Pass out/seizure activity   . Betaseron [Interferon Beta-1b] Rash    HOME MEDICATIONS:  Current Outpatient Medications:  .  Ascorbic Acid (VITAMIN C) 1000 MG tablet, Take 1,000 mg by mouth daily. , Disp: , Rfl:  .  Esomeprazole Magnesium (NEXIUM PO), Take 1 Dose by mouth daily., Disp: , Rfl:  .  Fingolimod HCl (GILENYA) 0.5 MG CAPS, Take 1 pill every day or as directed. (Patient taking differently: Take 1 capsule by mouth daily. ), Disp: 90 capsule, Rfl: 3 .  ibuprofen (ADVIL,MOTRIN) 200 MG tablet, Take by mouth., Disp: , Rfl:  .  Lactobacillus Rhamnosus, GG, (CULTURELLE PO), Take by mouth 2 (two) times daily., Disp: , Rfl:  .  Lysine 500 MG TABS, Take 1,000 mg by mouth daily. , Disp: , Rfl:  .  Magnesium Gluconate 550 MG TABS, Take 400 mg by mouth., Disp: , Rfl:  .  methIMAzole (TAPAZOLE PO), Take 15 mg by mouth daily., Disp: , Rfl:  .  SUMAtriptan (IMITREX) 50 MG tablet, Take 1 tablet (50 mg total) by mouth every 2 (two) hours as needed for migraine. May repeat in 2 hours if headache persists or recurs., Disp: 30 tablet, Rfl: 3 .  topiramate (TOPAMAX) 25 MG tablet, Take one tablet in the morning and 3 tablets at night, Disp: 360 tablet, Rfl: 3 .  Turmeric 500 MG TABS, Take 1,000 mg by mouth daily. , Disp: , Rfl:  .  VITAMIN D, ERGOCALCIFEROL, PO, Take 5,000 Units by mouth 2 (two) times  daily. , Disp: , Rfl:   PAST MEDICAL HISTORY: Past Medical History:  Diagnosis Date  . Anxiety   . Bell's palsy   . Hypertension   . Migraine   . MS (multiple sclerosis) ([place])   . Seizures ([place])   . Thyroid disease   . Vision abnormalities     PAST SURGICAL HISTORY: Past Surgical History:  Procedure Laterality Date  . ENDOMETRIAL ABLATION    . TUBAL LIGATION      FAMILY HISTORY: Family History  Problem Relation Age of Onset  . Dementia Mother   . Osteoarthritis Mother   . Transient ischemic attack Mother   . Hypertension Mother   . Heart disease Father   . Hypertension Father   . Diabetes Mellitus II Father   . Migraines Sister   . Heart attack Brother   . Colon cancer Brother   . Diverticulitis Brother   . Healthy Brother   . Tremor Brother   . Healthy Brother  SOCIAL HISTORY:  Social History   Socioeconomic History  . Marital status: Married    Spouse name: Not on file  . Number of children: Not on file  . Years of education: Not on file  . Highest education level: Not on file  Occupational History  . Not on file  Tobacco Use  . Smoking status: Never Smoker  . Smokeless tobacco: Never Used  Substance and Sexual Activity  . Alcohol use: No  . Drug use: Not on file  . Sexual activity: Not on file  Other Topics Concern  . Not on file  Social History Narrative  . Not on file   Social Determinants of Health   Financial Resource Strain:   . Difficulty of Paying Living Expenses:   Food Insecurity:   . Worried About Charity fundraiser in the Last Year:   . Arboriculturist in the Last Year:   Transportation Needs:   . Film/video editor (Medical):   Marland Kitchen Lack of Transportation (Non-Medical):   Physical Activity:   . Days of Exercise per Week:   . Minutes of Exercise per Session:   Stress:   . Feeling of Stress :   Social Connections:   . Frequency of Communication with Friends and Family:   . Frequency of Social Gatherings with Friends  and Family:   . Attends Religious Services:   . Active Member of Clubs or Organizations:   . Attends Archivist Meetings:   Marland Kitchen Marital Status:   Intimate Partner Violence:   . Fear of Current or Ex-Partner:   . Emotionally Abused:   Marland Kitchen Physically Abused:   . Sexually Abused:      PHYSICAL EXAM  Vitals:   09/22/19 1517  BP: (!) 150/98  Pulse: 89  Temp: (!) 96.8 F (36 C)  Weight: 240 lb (108.9 kg)  Height: 5\' 4"  (1.626 m)    Body mass index is 41.2 kg/m.   General: The patient is well-developed and well-nourished and in no acute distress.   Neck is nontender with good ROM   Neurologic Exam  Mental status: The patient is alert and oriented x 3 at the time of the examination. The patient has apparent normal recent and remote memory, with an apparently normal attention span and concentration ability.   Speech is normal.  Cranial nerves: Extraocular movements are full. Facial strength and sensation are normal.   Trapezius strength is normal.    The tongue is midline, and the patient has symmetric elevation of the soft palate. No obvious hearing deficits are noted.  Motor:  Muscle bulk is normal.   Tone is normal in the arms but mildly increased in the right leg. .   Strength is  5 / 5 in all 4 extremities.   Sensory: She has normal touch sensation in limbs  Coordination: Finger-nose-finger and heel-to-shin is performed well.  Gait and station: Station is normal.   The gait is normal.  Tandem gait is mildly wide.  Romberg is negative..   Reflexes: Deep tendon reflexes are symmetric and normal bilaterally.     ___________________________________  Multiple sclerosis ([place])  Migraine without aura and without status migrainosus, not intractable  High risk medication use  Gait disturbance   1.   Continue Gilenya.  We discussed that lymphocyte counts are usually low on this medication (typical values of 0.3-0.5).  Her lymphocyte count is a little lower at 0.2 but  we can continue the full dose.  She gets a reading of 0.1 we would need to consider reducing to every other day. 2.   Stay active and exercise as tolerated. 3.   Return in 6 months or sooner for new or worsening neurologic symptoms.

## 2019-09-23 ENCOUNTER — Other Ambulatory Visit: Payer: Self-pay | Admitting: *Deleted

## 2019-09-23 DIAGNOSIS — G35 Multiple sclerosis: Secondary | ICD-10-CM

## 2019-09-23 MED ORDER — GILENYA 0.5 MG PO CAPS: 1.0000 | ORAL_CAPSULE | Freq: Every day | ORAL | 3 refills | Status: DC

## 2019-09-23 MED ORDER — TOPIRAMATE 25 MG PO TABS: ORAL_TABLET | ORAL | 3 refills | Status: DC

## 2020-01-19 ENCOUNTER — Encounter: Payer: Self-pay | Admitting: *Deleted

## 2020-01-20 NOTE — Telephone Encounter (Signed)
Received fax from [employer] that PA Gilenya approved 01/19/20-01/18/21 #30/30days. Case number: [id number].

## 2020-01-20 NOTE — Telephone Encounter (Signed)
Submitted urgent PA for Gilenya on CMM. Key: [place]. Waiting on determination from [place].

## 2020-01-28 ENCOUNTER — Telehealth: Payer: Self-pay | Admitting: Neurology

## 2020-01-28 DIAGNOSIS — G35 Multiple sclerosis: Secondary | ICD-10-CM

## 2020-01-28 MED ORDER — GILENYA 0.5 MG PO CAPS: 1.0000 | ORAL_CAPSULE | Freq: Every day | ORAL | 3 refills | Status: DC

## 2020-01-28 NOTE — Telephone Encounter (Signed)
Gilenya Pharmeutical Program Lovena Le) confirm PA has been started and an update  Fingolimod HCl (GILENYA) 0.5 MG CAPS. Would like a call from the nurse.

## 2020-01-28 NOTE — Telephone Encounter (Signed)
Called back and spoke with [place]. Provided approval info for Gilenya. E-scribed refill to [facility] as requested.This is preferred specialty pharmacy for express scripts.   Already completed PA and it was approved: "Received fax from [employer] that [place] approved 01/19/20-01/18/21 #30/30days. Case number: [id number]."

## 2020-03-29 ENCOUNTER — Ambulatory Visit (INDEPENDENT_AMBULATORY_CARE_PROVIDER_SITE_OTHER): Payer: BC Managed Care – PPO | Admitting: Family Medicine

## 2020-03-29 ENCOUNTER — Other Ambulatory Visit: Payer: Self-pay

## 2020-03-29 ENCOUNTER — Encounter: Payer: Self-pay | Admitting: Family Medicine

## 2020-03-29 VITALS — BP 164/100 | HR 107 | Ht 64.0 in | Wt 250.0 lb

## 2020-03-29 DIAGNOSIS — Z79899 Other long term (current) drug therapy: Secondary | ICD-10-CM | POA: Diagnosis not present

## 2020-03-29 DIAGNOSIS — G43009 Migraine without aura, not intractable, without status migrainosus: Secondary | ICD-10-CM | POA: Diagnosis not present

## 2020-03-29 DIAGNOSIS — G35 Multiple sclerosis: Secondary | ICD-10-CM | POA: Diagnosis not present

## 2020-03-29 DIAGNOSIS — Z170 Estrogen receptor positive status [ER+]: Secondary | ICD-10-CM

## 2020-03-29 DIAGNOSIS — C50912 Malignant neoplasm of unspecified site of left female breast: Secondary | ICD-10-CM

## 2020-03-29 DIAGNOSIS — E559 Vitamin D deficiency, unspecified: Secondary | ICD-10-CM

## 2020-03-29 NOTE — Progress Notes (Signed)
Chief Complaint  Patient presents with  . Follow-up    RM 1  . Multiple Sclerosis    pt says she has no new sx     HISTORY OF PRESENT ILLNESS: Today 03/29/20  Shelly Boyd is a 59 y.o. female here today for follow up for RRMS. She continues Gilenya. Last MRI brain stable 2020. Last lymphocyte count 0.2 with [place], 0.3 with [facility] on 10/28. Vit D 62.4.   She is doing well from an MS standpoint. No new or exacerbating symptoms. Migraines are well managed. She continues topiramate 25mg  in am and 50-75mg  at bedtime. Sumatriptan helps to abort migraine.   Mood fairly stable. She is under more stress. 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 She has a lumpectomy planned next week. May need radiation therapy pending lymph node biopsy. No family history of breast cancer. She recently started a new job.   She is sleeping fairly well. She is not exercising as much as she would like. She is caring for her mother who is a SNF.   She has history of vitamin D deficiency. She takes Vitamin D 5000iu daily. BP is usually 140/80-90's at home. She reports white coat syndrome.   HISTORY (copied from Dr Garth Bigness note on 09/22/2019)  Shelly Boyd is a 59 y.o. woman who was diagnosed with relapsing remitting MS in 2012  Update 09/22/2019: She caught Covid-19 in January and was very tired but did not have any respiratory issues.   She is actually exercising more and now feeling better.   She feels she is walking well and can go 3 miles without stopping.   She denies muscle spasticity.   She denies numbness.   She had urinary hesitancy/reduced emptying.   She saw urology and did PT.   She has only recent UTI.  Vision is fine.  She wears bifocal glasses.     She is on Gilenya and tolerates it well.   Her lymphocyte counts were 0.2, 0.2 and 0.3 over the past 7 months.   We discussed that low lymphocytes counts are common and related to mechanism.   If counts  were 0.1 we would lower to qod.    She is noting more stress at work.   She denies depression or anxiety.   She sleeps well most nights.    Migraines are generally doing well.      REVIEW OF SYSTEMS: Out of a complete 14 system review of symptoms, the patient complains only of the following symptoms, increased stress levels, headaches and all other reviewed systems are negative.   ALLERGIES: Allergies  Allergen Reactions  . Benadryl [Diphenhydramine Hcl]     Pass out/seizure activity   . Betaseron [Interferon Beta-1b] Rash     HOME MEDICATIONS: Outpatient Medications Prior to Visit  Medication Sig Dispense Refill  . Ascorbic Acid (VITAMIN C) 1000 MG tablet Take 1,000 mg by mouth daily. 2x per week    . Esomeprazole Magnesium (NEXIUM PO) Take 1 Dose by mouth daily.    . Fingolimod HCl (GILENYA) 0.5 MG CAPS Take 1 capsule (0.5 mg total) by mouth daily. 90 capsule 3  . ibuprofen (ADVIL,MOTRIN) 200 MG tablet Take by mouth. PRN    . Lactobacillus Rhamnosus, GG, (CULTURELLE PO) Take by mouth 2 (two) times daily.    . Magnesium Gluconate 550 MG TABS Take 400 mg by mouth.    . methIMAzole (TAPAZOLE PO) Take 15 mg by mouth  daily.    . SUMAtriptan (IMITREX) 50 MG tablet Take 1 tablet (50 mg total) by mouth every 2 (two) hours as needed for migraine. May repeat in 2 hours if headache persists or recurs. 30 tablet 3  . topiramate (TOPAMAX) 25 MG tablet Take one tablet in the morning and 3 tablets at night 360 tablet 3  . Turmeric 500 MG TABS Take 1,000 mg by mouth daily.     Marland Kitchen VITAMIN D, ERGOCALCIFEROL, PO Take 5,000 Units by mouth 2 (two) times daily.     Marland Kitchen Lysine 500 MG TABS Take 1,000 mg by mouth daily.      No facility-administered medications prior to visit.     PAST MEDICAL HISTORY: Past Medical History:  Diagnosis Date  . Anxiety   . Bell's palsy   . Hypertension   . Migraine   . MS (multiple sclerosis) ([place])   . Seizures ([place])   . Thyroid disease   . Vision  abnormalities      PAST SURGICAL HISTORY: Past Surgical History:  Procedure Laterality Date  . ENDOMETRIAL ABLATION    . TUBAL LIGATION       FAMILY HISTORY: Family History  Problem Relation Age of Onset  . Dementia Mother   . Osteoarthritis Mother   . Transient ischemic attack Mother   . Hypertension Mother   . Heart disease Father   . Hypertension Father   . Diabetes Mellitus II Father   . Migraines Sister   . Heart attack Brother   . Colon cancer Brother   . Diverticulitis Brother   . Healthy Brother   . Tremor Brother   . Healthy Brother      SOCIAL HISTORY: Social History   Socioeconomic History  . Marital status: Married    Spouse name: Not on file  . Number of children: Not on file  . Years of education: Not on file  . Highest education level: Not on file  Occupational History  . Not on file  Tobacco Use  . Smoking status: Never Smoker  . Smokeless tobacco: Never Used  Substance and Sexual Activity  . Alcohol use: No  . Drug use: Not on file  . Sexual activity: Not on file  Other Topics Concern  . Not on file  Social History Narrative  . Not on file   Social Determinants of Health   Financial Resource Strain:   . Difficulty of Paying Living Expenses: Not on file  Food Insecurity:   . Worried About Charity fundraiser in the Last Year: Not on file  . Ran Out of Food in the Last Year: Not on file  Transportation Needs:   . Lack of Transportation (Medical): Not on file  . Lack of Transportation (Non-Medical): Not on file  Physical Activity:   . Days of Exercise per Week: Not on file  . Minutes of Exercise per Session: Not on file  Stress:   . Feeling of Stress : Not on file  Social Connections:   . Frequency of Communication with Friends and Family: Not on file  . Frequency of Social Gatherings with Friends and Family: Not on file  . Attends Religious Services: Not on file  . Active Member of Clubs or Organizations: Not on file  . Attends  Archivist Meetings: Not on file  . Marital Status: Not on file  Intimate Partner Violence:   . Fear of Current or Ex-Partner: Not on file  . Emotionally Abused: Not on file  .  Physically Abused: Not on file  . Sexually Abused: Not on file      PHYSICAL EXAM  Vitals:   03/29/20 1519 03/29/20 1544  BP: (!) 184/104 (!) 164/100  Pulse: (!) 107   Weight: 250 lb (113.4 kg)   Height: 5\' 4"  (1.626 m)    Body mass index is 42.91 kg/m.   Generalized: Well developed, in no acute distress   Neurological examination  Mentation: Alert oriented to time, place, history taking. Follows all commands speech and language fluent Cranial nerve II-XII: Pupils were equal round reactive to light. Extraocular movements were full, visual field were full on confrontational test. Facial sensation and strength were normal. Head turning and shoulder shrug  were normal and symmetric. Motor: The motor testing reveals 5 over 5 strength of all 4 extremities. Good symmetric motor tone is noted throughout.  Sensory: Sensory testing is intact to soft touch on all 4 extremities. No evidence of extinction is noted.  Coordination: Cerebellar testing reveals good finger-nose-finger and heel-to-shin bilaterally.  Gait and station: Gait is normal.  Reflexes: Deep tendon reflexes are symmetric and normal bilaterally.     DIAGNOSTIC DATA (LABS, IMAGING, TESTING) - I reviewed patient records, labs, notes, testing and imaging myself where available.  Lab Results  Component Value Date   WBC 3.6 02/12/2018   HGB 13.5 02/12/2018   HCT 42.7 02/12/2018   MCV 92 02/12/2018   PLT 249 02/12/2018   No results found for: NA, K, CL, CO2, GLUCOSE, BUN, CREATININE, CALCIUM, PROT, ALBUMIN, AST, ALT, ALKPHOS, BILITOT, GFRNONAA, GFRAA No results found for: CHOL, HDL, LDLCALC, LDLDIRECT, TRIG, CHOLHDL No results found for: HGBA1C No results found for: VITAMINB12 No results found for: TSH    ASSESSMENT AND  PLAN  59 y.o. year old female  has a past medical history of Anxiety, Bell's palsy, Hypertension, Migraine, MS (multiple sclerosis) ([place]), Seizures ([place]), Thyroid disease, and Vision abnormalities. here with   Relapsing remitting multiple sclerosis ([place])  Ductal carcinoma of breast, estrogen receptor positive, stage 2, left ([facility])  High risk medication use  Migraine without aura and without status migrainosus, not intractable  Vitamin D deficiency   Krishna is doing well from any standpoint.  We will continue Gilenya as prescribed.  She will continue topiramate 25 mg in the a.m. and 50 mg in the p.m for migraine prevention and sumatriptan tablets for abortive therapy.  She will continue to keep a close eye on her blood pressures at home.  I have provided her with additional information regarding DASH diet.  I will update Dr. Felecia Shelling regarding her new diagnosis of breast cancer.  Healthy lifestyle habits encouraged.  She will follow-up with Dr. Felecia Shelling in 6 months, sooner if needed.  She verbalizes understanding and agreement with this plan.  I spent 30 minutes of face-to-face and non-face-to-face time with patient.  This included previsit chart review, lab review, study review, order entry, electronic health record documentation, patient education.    Debbora Presto, MSN, FNP-C 03/29/2020, 4:10 PM  [facility] [address], [place], [place] [address] [phone]

## 2020-03-29 NOTE — Patient Instructions (Addendum)
Below is our plan:  We will continue current treatment plan.   Please make sure you are staying well hydrated. I recommend 50-60 ounces daily. Well balanced diet and regular exercise encouraged.    Please continue follow up with care team as directed.   Follow up with Dr Felecia Shelling in 6 months   You may receive a survey regarding today's visit. I encourage you to leave honest feed back as I do use this information to improve patient care. Thank you for seeing me today!      Multiple Sclerosis Multiple sclerosis (MS) is a disease of the brain, spinal cord, and optic nerves (central nervous system). It causes the body's disease-fighting (immune) system to destroy the protective covering (myelin sheath) around nerves in the brain. When this happens, signals (nerve impulses) going to and from the brain and spinal cord do not get sent properly or may not get sent at all. There are several types of MS:  Relapsing-remitting MS. This is the most common type. This causes sudden attacks of symptoms. After an attack, you may recover completely until the next attack, or some symptoms may remain permanently.  Secondary progressive MS. This usually develops after the onset of relapsing-remitting MS. Similar to relapsing-remitting MS, this type also causes sudden attacks of symptoms. Attacks may be less frequent, but symptoms slowly get worse (progress) over time.  Primary progressive MS. This causes symptoms that steadily progress over time. This type of MS does not cause sudden attacks of symptoms. The age of onset of MS varies, but it often develops between 42-19 years of age. MS is a lifelong (chronic) condition. There is no cure, but treatment can help slow down the progression of the disease. What are the causes? The cause of this condition is not known. What increases the risk? You are more likely to develop this condition if:  You are a woman.  You have a relative with MS. However, the condition  is not passed from parent to child (inherited).  You have a lack (deficiency) of vitamin D.  You smoke. MS is more common in the [place] than in the [place]. What are the signs or symptoms? Relapsing-remitting and secondary progressive MS cause symptoms to occur in episodes or attacks that may last weeks to months. There may be long periods between attacks in which there are almost no symptoms. Primary progressive MS causes symptoms to steadily progress after they develop. Symptoms of MS vary because of the many different ways it affects the central nervous system. The main symptoms include:  Vision problems and eye pain.  Numbness.  Weakness.  Inability to move your arms, hands, feet, or legs (paralysis).  Balance problems.  Shaking that you cannot control (tremors).  Muscle spasms.  Problems with thinking (cognitive changes). MS can also cause symptoms that are associated with the disease, but are not always the direct result of an MS attack. They may include:  Inability to control urination or bowel movements (incontinence).  Headaches.  Fatigue.  Inability to tolerate heat.  Emotional changes.  Depression.  Pain. How is this diagnosed? This condition is diagnosed based on:  Your symptoms.  A neurological exam. This involves checking central nervous system function, such as nerve function, reflexes, and coordination.  MRIs of the brain and spinal cord.  Lab tests, including a lumbar puncture that tests the fluid that surrounds the brain and spinal cord (cerebrospinal fluid).  Tests to measure the electrical activity of the brain  in response to stimulation (evoked potentials). How is this treated? There is no cure for MS, but medicines can help decrease the number and frequency of attacks and help relieve nuisance symptoms. Treatment options may include:  Medicines that reduce the frequency of attacks. These medicines may be given  by injection, by mouth (orally), or through an IV.  Medicines that reduce inflammation (steroids). These may provide short-term relief of symptoms.  Medicines to help control pain, depression, fatigue, or incontinence.  Vitamin D, if you have a deficiency.  Using devices to help you move around (assistive devices), such as braces, a cane, or a walker.  Physical therapy to strengthen and stretch your muscles.  Occupational therapy to help you with everyday tasks.  Alternative or complementary treatments such as exercise, massage, or acupuncture. Follow these instructions at home:  Take over-the-counter and prescription medicines only as told by your health care provider.  Do not drive or use heavy machinery while taking prescription pain medicine.  Use assistive devices as recommended by your physical therapist or your health care provider.  Exercise as directed by your health care provider.  Return to your normal activities as told by your health care provider. Ask your health care provider what activities are safe for you.  Reach out for support. Share your feelings with friends, family, or a support group.  Keep all follow-up visits as told by your health care provider and therapists. This is important. Where to find more information  National Multiple Sclerosis Society: https://www.nationalmssociety.org Contact a health care provider if:  You feel depressed.  You develop new pain or numbness.  You have tremors.  You have problems with sexual function. Get help right away if:  You develop paralysis.  You develop numbness.  You have problems with your bladder or bowel function.  You develop double vision.  You lose vision in one or both eyes.  You develop suicidal thoughts.  You develop severe confusion. If you ever feel like you may hurt yourself or others, or have thoughts about taking your own life, get help right away. You can go to your nearest emergency  department or call:  Your local emergency services (911 in the [place]  A suicide crisis helpline, such as the [place] at 216-882-9902. This is open 24 hours a day. Summary  Multiple sclerosis (MS) is a disease of the central nervous system that causes the body's immune system to destroy the protective covering (myelin sheath) around nerves in the brain.  There are 3 types of MS: relapsing-remitting, secondary progressive, and primary progressive. Relapsing-remitting and secondary progressive MS cause symptoms to occur in episodes or attacks that may last weeks to months. Primary progressive MS causes symptoms to steadily progress after they develop.  There is no cure for MS, but medicines can help decrease the number and frequency of attacks and help relieve nuisance symptoms. Treatment may also include physical or occupational therapy.  If you develop numbness, paralysis, vision problems, or other neurological symptoms, get help right away. This information is not intended to replace advice given to you by your health care provider. Make sure you discuss any questions you have with your health care provider. Document Revised: 04/19/2017 Document Reviewed: 07/16/2016 Elsevier Patient Education  [place] DASH stands for "Dietary Approaches to Stop Hypertension." The DASH eating plan is a healthy eating plan that has been shown to reduce high blood pressure (hypertension). It may also reduce your  risk for type 2 diabetes, heart disease, and stroke. The DASH eating plan may also help with weight loss. What are tips for following this plan?  General guidelines  Avoid eating more than 2,300 mg (milligrams) of salt (sodium) a day. If you have hypertension, you may need to reduce your sodium intake to 1,500 mg a day.  Limit alcohol intake to no more than 1 drink a day for nonpregnant women and 2 drinks a day for men. One drink equals  12 oz of beer, 5 oz of wine, or 1 oz of hard liquor.  Work with your health care provider to maintain a healthy body weight or to lose weight. Ask what an ideal weight is for you.  Get at least 30 minutes of exercise that causes your heart to beat faster (aerobic exercise) most days of the week. Activities may include walking, swimming, or biking.  Work with your health care provider or diet and nutrition specialist (dietitian) to adjust your eating plan to your individual calorie needs. Reading food labels   Check food labels for the amount of sodium per serving. Choose foods with less than 5 percent of the Daily Value of sodium. Generally, foods with less than 300 mg of sodium per serving fit into this eating plan.  To find whole grains, look for the word "whole" as the first word in the ingredient list. Shopping  Buy products labeled as "low-sodium" or "no salt added."  Buy fresh foods. Avoid canned foods and premade or frozen meals. Cooking  Avoid adding salt when cooking. Use salt-free seasonings or herbs instead of table salt or sea salt. Check with your health care provider or pharmacist before using salt substitutes.  Do not fry foods. Cook foods using healthy methods such as baking, boiling, grilling, and broiling instead.  Cook with heart-healthy oils, such as olive, canola, soybean, or sunflower oil. Meal planning  Eat a balanced diet that includes: ? 5 or more servings of fruits and vegetables each day. At each meal, try to fill half of your plate with fruits and vegetables. ? Up to 6-8 servings of whole grains each day. ? Less than 6 oz of lean meat, poultry, or fish each day. A 3-oz serving of meat is about the same size as a deck of cards. One egg equals 1 oz. ? 2 servings of low-fat dairy each day. ? A serving of nuts, seeds, or beans 5 times each week. ? Heart-healthy fats. Healthy fats called Omega-3 fatty acids are found in foods such as flaxseeds and coldwater  fish, like sardines, salmon, and mackerel.  Limit how much you eat of the following: ? Canned or prepackaged foods. ? Food that is high in trans fat, such as fried foods. ? Food that is high in saturated fat, such as fatty meat. ? Sweets, desserts, sugary drinks, and other foods with added sugar. ? Full-fat dairy products.  Do not salt foods before eating.  Try to eat at least 2 vegetarian meals each week.  Eat more home-cooked food and less restaurant, buffet, and fast food.  When eating at a restaurant, ask that your food be prepared with less salt or no salt, if possible. What foods are recommended? The items listed may not be a complete list. Talk with your dietitian about what dietary choices are best for you. Grains Whole-grain or whole-wheat bread. Whole-grain or whole-wheat pasta. Brown rice. [place]. Bulgur. Whole-grain and low-sodium cereals. Pita bread. Low-fat, low-sodium crackers. Whole-wheat flour tortillas. Vegetables  Fresh or frozen vegetables (raw, steamed, roasted, or grilled). Low-sodium or reduced-sodium tomato and vegetable juice. Low-sodium or reduced-sodium tomato sauce and tomato paste. Low-sodium or reduced-sodium canned vegetables. Fruits All fresh, dried, or frozen fruit. Canned fruit in natural juice (without added sugar). Meat and other protein foods Skinless chicken or [place]. Ground chicken or [place]. Pork with fat trimmed off. Fish and seafood. Egg whites. Dried beans, peas, or lentils. Unsalted nuts, nut butters, and seeds. Unsalted canned beans. Lean cuts of beef with fat trimmed off. Low-sodium, lean deli meat. Dairy Low-fat (1%) or fat-free (skim) milk. Fat-free, low-fat, or reduced-fat cheeses. Nonfat, low-sodium ricotta or cottage cheese. Low-fat or nonfat yogurt. Low-fat, low-sodium cheese. Fats and oils Soft margarine without trans fats. Vegetable oil. Low-fat, reduced-fat, or light mayonnaise and salad dressings (reduced-sodium). Canola,  safflower, olive, soybean, and sunflower oils. Avocado. Seasoning and other foods Herbs. Spices. Seasoning mixes without salt. Unsalted popcorn and pretzels. Fat-free sweets. What foods are not recommended? The items listed may not be a complete list. Talk with your dietitian about what dietary choices are best for you. Grains Baked goods made with fat, such as croissants, muffins, or some breads. Dry pasta or rice meal packs. Vegetables Creamed or fried vegetables. Vegetables in a cheese sauce. Regular canned vegetables (not low-sodium or reduced-sodium). Regular canned tomato sauce and paste (not low-sodium or reduced-sodium). Regular tomato and vegetable juice (not low-sodium or reduced-sodium). Angie Fava. Olives. Fruits Canned fruit in a light or heavy syrup. Fried fruit. Fruit in cream or butter sauce. Meat and other protein foods Fatty cuts of meat. Ribs. Fried meat. Berniece Salines. Sausage. Bologna and other processed lunch meats. Salami. Fatback. Hotdogs. Bratwurst. Salted nuts and seeds. Canned beans with added salt. Canned or smoked fish. Whole eggs or egg yolks. Chicken or [place] with skin. Dairy Whole or 2% milk, cream, and half-and-half. Whole or full-fat cream cheese. Whole-fat or sweetened yogurt. Full-fat cheese. Nondairy creamers. Whipped toppings. Processed cheese and cheese spreads. Fats and oils Butter. Stick margarine. Lard. Shortening. Ghee. Bacon fat. Tropical oils, such as coconut, palm kernel, or palm oil. Seasoning and other foods Salted popcorn and pretzels. Onion salt, garlic salt, seasoned salt, table salt, and sea salt. Worcestershire sauce. Tartar sauce. Barbecue sauce. Teriyaki sauce. Soy sauce, including reduced-sodium. Steak sauce. Canned and packaged gravies. Fish sauce. Oyster sauce. Cocktail sauce. Horseradish that you find on the shelf. Ketchup. Mustard. Meat flavorings and tenderizers. Bouillon cubes. Hot sauce and Tabasco sauce. Premade or packaged marinades. Premade or  packaged taco seasonings. Relishes. Regular salad dressings. Where to find more information:  National Heart, Lung, and [place]: https://wilson-eaton.com/  American Heart Association: www.heart.org Summary  The DASH eating plan is a healthy eating plan that has been shown to reduce high blood pressure (hypertension). It may also reduce your risk for type 2 diabetes, heart disease, and stroke.  With the DASH eating plan, you should limit salt (sodium) intake to 2,300 mg a day. If you have hypertension, you may need to reduce your sodium intake to 1,500 mg a day.  When on the DASH eating plan, aim to eat more fresh fruits and vegetables, whole grains, lean proteins, low-fat dairy, and heart-healthy fats.  Work with your health care provider or diet and nutrition specialist (dietitian) to adjust your eating plan to your individual calorie needs. This information is not intended to replace advice given to you by your health care provider. Make sure you discuss any questions you have with your health care provider. Document Revised:  04/19/2017 Document Reviewed: 04/30/2016 Elsevier Patient Education  [place].  Hypertension, Adult High blood pressure (hypertension) is when the force of blood pumping through the arteries is too strong. The arteries are the blood vessels that carry blood from the heart throughout the body. Hypertension forces the heart to work harder to pump blood and may cause arteries to become narrow or stiff. Untreated or uncontrolled hypertension can cause a heart attack, heart failure, a stroke, kidney disease, and other problems. A blood pressure reading consists of a higher number over a lower number. Ideally, your blood pressure should be below 120/80. The first ("top") number is called the systolic pressure. It is a measure of the pressure in your arteries as your heart beats. The second ("bottom") number is called the diastolic pressure. It is a measure of the  pressure in your arteries as the heart relaxes. What are the causes? The exact cause of this condition is not known. There are some conditions that result in or are related to high blood pressure. What increases the risk? Some risk factors for high blood pressure are under your control. The following factors may make you more likely to develop this condition:  Smoking.  Having type 2 diabetes mellitus, high cholesterol, or both.  Not getting enough exercise or physical activity.  Being overweight.  Having too much fat, sugar, calories, or salt (sodium) in your diet.  Drinking too much alcohol. Some risk factors for high blood pressure may be difficult or impossible to change. Some of these factors include:  Having chronic kidney disease.  Having a family history of high blood pressure.  Age. Risk increases with age.  Race. You may be at higher risk if you are African American.  Gender. Men are at higher risk than women before age 4. After age 76, women are at higher risk than men.  Having obstructive sleep apnea.  Stress. What are the signs or symptoms? High blood pressure may not cause symptoms. Very high blood pressure (hypertensive crisis) may cause:  Headache.  Anxiety.  Shortness of breath.  Nosebleed.  Nausea and vomiting.  Vision changes.  Severe chest pain.  Seizures. How is this diagnosed? This condition is diagnosed by measuring your blood pressure while you are seated, with your arm resting on a flat surface, your legs uncrossed, and your feet flat on the floor. The cuff of the blood pressure monitor will be placed directly against the skin of your upper arm at the level of your heart. It should be measured at least twice using the same arm. Certain conditions can cause a difference in blood pressure between your right and left arms. Certain factors can cause blood pressure readings to be lower or higher than normal for a short period of time:  When  your blood pressure is higher when you are in a health care provider's office than when you are at home, this is called white coat hypertension. Most people with this condition do not need medicines.  When your blood pressure is higher at home than when you are in a health care provider's office, this is called masked hypertension. Most people with this condition may need medicines to control blood pressure. If you have a high blood pressure reading during one visit or you have normal blood pressure with other risk factors, you may be asked to:  Return on a different day to have your blood pressure checked again.  Monitor your blood pressure at home for 1 week or  longer. If you are diagnosed with hypertension, you may have other blood or imaging tests to help your health care provider understand your overall risk for other conditions. How is this treated? This condition is treated by making healthy lifestyle changes, such as eating healthy foods, exercising more, and reducing your alcohol intake. Your health care provider may prescribe medicine if lifestyle changes are not enough to get your blood pressure under control, and if:  Your systolic blood pressure is above 130.  Your diastolic blood pressure is above 80. Your personal target blood pressure may vary depending on your medical conditions, your age, and other factors. Follow these instructions at home: Eating and drinking   Eat a diet that is high in fiber and potassium, and low in sodium, added sugar, and fat. An example eating plan is called the DASH (Dietary Approaches to Stop Hypertension) diet. To eat this way: ? Eat plenty of fresh fruits and vegetables. Try to fill one half of your plate at each meal with fruits and vegetables. ? Eat whole grains, such as whole-wheat pasta, brown rice, or whole-grain bread. Fill about one fourth of your plate with whole grains. ? Eat or drink low-fat dairy products, such as skim milk or low-fat  yogurt. ? Avoid fatty cuts of meat, processed or cured meats, and poultry with skin. Fill about one fourth of your plate with lean proteins, such as fish, chicken without skin, beans, eggs, or tofu. ? Avoid pre-made and processed foods. These tend to be higher in sodium, added sugar, and fat.  Reduce your daily sodium intake. Most people with hypertension should eat less than 1,500 mg of sodium a day.  Do not drink alcohol if: ? Your health care provider tells you not to drink. ? You are pregnant, may be pregnant, or are planning to become pregnant.  If you drink alcohol: ? Limit how much you use to:  0-1 drink a day for women.  0-2 drinks a day for men. ? Be aware of how much alcohol is in your drink. In the [place] one drink equals one 12 oz bottle of beer (355 mL), one 5 oz glass of wine (148 mL), or one 1 oz glass of hard liquor (44 mL). Lifestyle   Work with your health care provider to maintain a healthy body weight or to lose weight. Ask what an ideal weight is for you.  Get at least 30 minutes of exercise most days of the week. Activities may include walking, swimming, or biking.  Include exercise to strengthen your muscles (resistance exercise), such as Pilates or lifting weights, as part of your weekly exercise routine. Try to do these types of exercises for 30 minutes at least 3 days a week.  Do not use any products that contain nicotine or tobacco, such as cigarettes, e-cigarettes, and chewing tobacco. If you need help quitting, ask your health care provider.  Monitor your blood pressure at home as told by your health care provider.  Keep all follow-up visits as told by your health care provider. This is important. Medicines  Take over-the-counter and prescription medicines only as told by your health care provider. Follow directions carefully. Blood pressure medicines must be taken as prescribed.  Do not skip doses of blood pressure medicine. Doing this puts you at risk  for problems and can make the medicine less effective.  Ask your health care provider about side effects or reactions to medicines that you should watch for. Contact a health care provider  if you:  Think you are having a reaction to a medicine you are taking.  Have headaches that keep coming back (recurring).  Feel dizzy.  Have swelling in your ankles.  Have trouble with your vision. Get help right away if you:  Develop a severe headache or confusion.  Have unusual weakness or numbness.  Feel faint.  Have severe pain in your chest or abdomen.  Vomit repeatedly.  Have trouble breathing. Summary  Hypertension is when the force of blood pumping through your arteries is too strong. If this condition is not controlled, it may put you at risk for serious complications.  Your personal target blood pressure may vary depending on your medical conditions, your age, and other factors. For most people, a normal blood pressure is less than 120/80.  Hypertension is treated with lifestyle changes, medicines, or a combination of both. Lifestyle changes include losing weight, eating a healthy, low-sodium diet, exercising more, and limiting alcohol. This information is not intended to replace advice given to you by your health care provider. Make sure you discuss any questions you have with your health care provider. Document Revised: 01/15/2018 Document Reviewed: 01/15/2018 Elsevier Patient Education  2020 [employer].

## 2020-03-30 NOTE — Progress Notes (Signed)
I have read the note, and I agree with the clinical assessment and plan.  Latravious Levitt A. Sho Salguero, MD, PhD, FAAN Certified in Neurology, Clinical Neurophysiology, Sleep Medicine, Pain Medicine and Neuroimaging  [facility] [address], [address] [place], [place] [address] [phone]  

## 2020-05-31 ENCOUNTER — Telehealth: Payer: Self-pay | Admitting: *Deleted

## 2020-05-31 NOTE — Telephone Encounter (Signed)
Called and spoke to [facility] support.  Pt has insurance change [employer], [facility] Gilenya 0.5mg  po daily sinc 06/2015, Dx MS 2012 (previous meds betaseron-rash, tysabri - elevated JCV titer then changed to gilenya 202017- stable.  [employer] M/M ID [id number], ID for medications [id number] RX BIN [id number] PCN [id number], GRP [id number].  CMM KEY # [id number] 05-31-20. Pending determination.

## 2020-06-01 NOTE — Telephone Encounter (Signed)
Received approval already on file 01-19-20 thru 01-18-21, [employer].  Fax confirmation received [phone], fax [phone].

## 2020-06-01 NOTE — Telephone Encounter (Signed)
Received more information from [facility] that pt new with [facility] pharmacy [facility]. I have Initiated CMM KEY # [id number].  Determination pending.

## 2020-06-14 NOTE — Telephone Encounter (Addendum)
Received approval for Gilenya on [facility] 05-15-20 thru 06-14-2021. [facility]  CASE ID [id number]. Spoke to [employer] and Lex[employer].

## 2020-09-01 ENCOUNTER — Other Ambulatory Visit: Payer: Self-pay | Admitting: *Deleted

## 2020-09-01 MED ORDER — TOPIRAMATE 25 MG PO TABS: ORAL_TABLET | ORAL | 0 refills | Status: DC

## 2020-09-04 ENCOUNTER — Encounter: Payer: Self-pay | Admitting: Family Medicine

## 2020-09-27 ENCOUNTER — Ambulatory Visit: Payer: BC Managed Care – PPO | Admitting: Neurology

## 2020-11-10 ENCOUNTER — Other Ambulatory Visit: Payer: Self-pay

## 2020-11-10 ENCOUNTER — Ambulatory Visit (INDEPENDENT_AMBULATORY_CARE_PROVIDER_SITE_OTHER): Payer: Managed Care, Other (non HMO) | Admitting: Neurology

## 2020-11-10 ENCOUNTER — Encounter: Payer: Self-pay | Admitting: Neurology

## 2020-11-10 VITALS — BP 155/111 | HR 106 | Ht 64.0 in | Wt 244.5 lb

## 2020-11-10 DIAGNOSIS — Z79899 Other long term (current) drug therapy: Secondary | ICD-10-CM | POA: Diagnosis not present

## 2020-11-10 DIAGNOSIS — G35 Multiple sclerosis: Secondary | ICD-10-CM | POA: Diagnosis not present

## 2020-11-10 DIAGNOSIS — C50912 Malignant neoplasm of unspecified site of left female breast: Secondary | ICD-10-CM

## 2020-11-10 DIAGNOSIS — G43009 Migraine without aura, not intractable, without status migrainosus: Secondary | ICD-10-CM | POA: Diagnosis not present

## 2020-11-10 DIAGNOSIS — Z170 Estrogen receptor positive status [ER+]: Secondary | ICD-10-CM

## 2020-11-10 DIAGNOSIS — G5711 Meralgia paresthetica, right lower limb: Secondary | ICD-10-CM | POA: Insufficient documentation

## 2020-11-10 MED ORDER — GABAPENTIN 300 MG PO CAPS: 300.0000 mg | ORAL_CAPSULE | Freq: Three times a day (TID) | ORAL | 5 refills | Status: DC

## 2020-11-10 NOTE — Progress Notes (Signed)
[facility]  PATIENT: Shelly Boyd DOB: [birth year]  REFERRING DOCTOR OR PCP:  Dr. Maye Hides, PCP is [name] SOURCE: patient, notes rom Dr. Starleen Blue, lab/imaging results, MRI images on CD/PACS  _________________________________   HISTORICAL  HISTORY OF PRESENT ILLNESS:  Shelly Boyd is a 60 y.o. woman who was diagnosed with relapsing remitting MS in 2012  Update 11/10/2020: She is on Gilenya and tolerates it well.   Her lymphocyte counts were 0.2, 0.2 and 0.3 over the past 7 months.   We discussed that low lymphocytes counts are common and related to mechanism.   If counts were 0.1 we would lower to qod.    She feels she is walking well in general but had one fall when her toe caught on a dent in the floor.  She can walk 2 mile without stopping.  She   She denies muscle weakness or spasticity.   She denies numbness.   Bladder is doing better since starting macrobid after sex.     Vision is fine.    She notes some fatigue that varies.   She tries to stay active and works 40 hours.    She is sleeping well.   She has been tearful a few times but denies depression.    Cognition is doing well.  She is noting more stress at work.   She denies depression or anxiety.   She sleeps well most nights.    Migraines are generally doing well.    Last CBC/D showed lymphocytes 0.3  She was diagnosed with breast cancer and had surgery earlier this year and had RadRx.  Marland Kitchen  Lymph nodes were clear.    She is on anastrozole.   A recent bone scan was fine.     She is having more pain in the right hip and right leg.   This started in March.  She took some of her husband's gabapentin and had benefit.    Her new job she needs to sit all day and wonders if that is playing a role.    She is now trying to stand up and move every hour.      She has some migraines.   She takes topamax   Imitrex helps but seems to cause joint pain.        MS History:   She was diagnosed with MS in 2012  after presenting with pain and numbness in the right arm and blurry vision. Her ophthalmologist diagnosed her with nystagmus. She was referred to Dr. Starleen Blue in advance and was diagnosed with MS after an MRI. Combination of her symptoms and classic MRI were sufficient to diagnose MS without the need to undergo a lumbar puncture. She received 3 days of IV Solu-Medrol and was back to baseline in 3 weeks..  Initially, she was placed on Betaseron. She had a rash with that and was switched to Tysabri. After 42 infusions, she switched due to a high JCV antibody titer (0.66).  Since February 2017 she has been on Gilenya. She tolerates it well and has not had any exacerbations.    Her last MRI of the brain was performed December 2017. Personally reviewed the images. She she has multiple T2/FLAIR hyperintense foci, predominantly in the periventricular white matter. Most are radially oriented to the ventricles and some are hypointense on T1-weighted images. None of the foci enhanced. There were no acute findings.     Imaging: MRI of the brain 11/25/2018 showed Multiple T2/FLAIR hyperintense foci  predominantly in the periventricular white matter of the hemispheres in a pattern and configuration consistent with chronic demyelinating plaque associated with multiple sclerosis.  None of the foci enhance or appear to be acute.  Compared to the MRI dated 08/22/2017, there do not appear to be any new lesions.      There is a normal enhancement pattern and no acute findings  MRI of the brain 08/22/2017 showed Multiple T2/FLAIR hyperintense foci, predominantly in the periventricular white matter in a pattern and configuration consistent with chronic demyelinating plaque associated with multiple sclerosis.  None of the foci appears to be acute.  When compared to the MRI dated 04/26/2016, there is no interim change.  REVIEW OF SYSTEMS: Constitutional: No fevers, chills, sweats, or change in appetite.   She reports fatigue.   Has gained  a few pounds over the past year.   Eyes: No visual changes, double vision, eye pain Ear, nose and throat: No hearing loss, ear pain, nasal congestion, sore throat Cardiovascular: No chest pain.  She has had palpitations Respiratory:  No shortness of breath at rest or with exertion.   No wheezes.   Snores, possible OSA. GastrointestinaI: No nausea, vomiting, diarrhea, abdominal pain, fecal incontinence Genitourinary:  No dysuria, urinary retention or frequency.  2 x  nocturia. Musculoskeletal:  No neck pain, back pain Integumentary: No rash, pruritus, skin lesions Neurological: as above Psychiatric: No depression at this time.  No anxiety Endocrine: She sees endocrinology for a goiter and subclinical hyperthyroidism (Dr. Tawnya Crook).  No diaphoresis, change in appetite, change in weigh or increased thirst Hematologic/Lymphatic:  No anemia, purpura, petechiae. Allergic/Immunologic: No itchy/runny eyes, nasal congestion, recent allergic reactions, rashes  ALLERGIES: Allergies  Allergen Reactions   Benadryl [Diphenhydramine Hcl]     Pass out/seizure activity    Betaseron [Interferon Beta-1b] Rash    HOME MEDICATIONS:  Current Outpatient Medications:    anastrozole (ARIMIDEX) 1 MG tablet, Take 1 mg by mouth daily., Disp: , Rfl:    Ascorbic Acid (VITAMIN C) 1000 MG tablet, Take 1,000 mg by mouth daily. 2x per week, Disp: , Rfl:    Esomeprazole Magnesium (NEXIUM PO), Take 1 Dose by mouth daily., Disp: , Rfl:    Fingolimod HCl (GILENYA) 0.5 MG CAPS, Take 1 capsule (0.5 mg total) by mouth daily., Disp: 90 capsule, Rfl: 3   gabapentin (NEURONTIN) 300 MG capsule, Take 1 capsule (300 mg total) by mouth 3 (three) times daily., Disp: 90 capsule, Rfl: 5   ibuprofen (ADVIL,MOTRIN) 200 MG tablet, Take by mouth. PRN, Disp: , Rfl:    Lactobacillus Rhamnosus, GG, (CULTURELLE PO), Take by mouth 2 (two) times daily., Disp: , Rfl:    Magnesium Gluconate 550 MG TABS, Take 400 mg by mouth., Disp: , Rfl:     methIMAzole (TAPAZOLE PO), Take 15 mg by mouth daily., Disp: , Rfl:    SUMAtriptan (IMITREX) 50 MG tablet, Take 1 tablet (50 mg total) by mouth every 2 (two) hours as needed for migraine. May repeat in 2 hours if headache persists or recurs., Disp: 30 tablet, Rfl: 3   topiramate (TOPAMAX) 25 MG tablet, Take one tablet in the morning and 3 tablets at night, Disp: 360 tablet, Rfl: 0   Turmeric 500 MG TABS, Take 1,000 mg by mouth daily. , Disp: , Rfl:    VITAMIN D, ERGOCALCIFEROL, PO, Take 5,000 Units by mouth 2 (two) times daily. , Disp: , Rfl:   PAST MEDICAL HISTORY: Past Medical History:  Diagnosis Date   Anxiety  Bell's palsy    Hypertension    Migraine    MS (multiple sclerosis) (HCC)    Seizures ([place])    Thyroid disease    Vision abnormalities     PAST SURGICAL HISTORY: Past Surgical History:  Procedure Laterality Date   ENDOMETRIAL ABLATION     TUBAL LIGATION      FAMILY HISTORY: Family History  Problem Relation Age of Onset   Dementia Mother    Osteoarthritis Mother    Transient ischemic attack Mother    Hypertension Mother    Heart disease Father    Hypertension Father    Diabetes Mellitus II Father    Migraines Sister    Heart attack Brother    Colon cancer Brother    Diverticulitis Brother    Healthy Brother    Tremor Brother    Healthy Brother     SOCIAL HISTORY:  Social History   Socioeconomic History   Marital status: Married    Spouse name: Not on file   Number of children: Not on file   Years of education: Not on file   Highest education level: Not on file  Occupational History   Not on file  Tobacco Use   Smoking status: Never   Smokeless tobacco: Never  Substance and Sexual Activity   Alcohol use: No   Drug use: Not on file   Sexual activity: Not on file  Other Topics Concern   Not on file  Social History Narrative   Not on file   Social Determinants of Health   Financial Resource Strain: Not on file  Food Insecurity: Not on  file  Transportation Needs: Not on file  Physical Activity: Not on file  Stress: Not on file  Social Connections: Not on file  Intimate Partner Violence: Not on file     PHYSICAL EXAM  Vitals:   11/10/20 1535  BP: (!) 155/111  Pulse: (!) 106  Weight: 244 lb 8 oz (110.9 kg)  Height: 5\' 4"  (1.626 m)    Body mass index is 41.97 kg/m.   General: The patient is well-developed and well-nourished and in no acute distress.   Neck is nontender with good ROM   Neurologic Exam  Mental status: The patient is alert and oriented x 3 at the time of the examination. The patient has apparent normal recent and remote memory, with an apparently normal attention span and concentration ability.   Speech is normal.  Cranial nerves: Extraocular movements are full. Facial strength and sensation are normal.   Trapezius strength is normal.    The tongue is midline, and the patient has symmetric elevation of the soft palate. No obvious hearing deficits are noted.  Motor:  Muscle bulk is normal.   Tone is normal in the arms but mildly increased in the right leg. .   Strength is  5 / 5 in all 4 extremities.   Sensory: She has normal touch sensation in arms.  She has reduced sensation to touch and temperature in the distribution of the right lateral femoral cutaneous nerve  Coordination: Finger-nose-finger and heel-to-shin is performed well.  Gait and station: Station is normal.   The gait is normal.  Tandem gait is mildly wide.  Romberg is negative..   Reflexes: Deep tendon reflexes are symmetric and normal bilaterally.     ___________________________________  Multiple sclerosis ([place]) - Plan: CBC with Differential/Platelet, Hepatic function panel  High risk medication use - Plan: CBC with Differential/Platelet, Hepatic function panel  Migraine  without aura and without status migrainosus, not intractable  Ductal carcinoma of breast, estrogen receptor positive, stage 2, left (HCC)  Lateral femoral  cutaneous entrapment syndrome, right   1.   Continue Gilenya.  Check labs.  If the lymphocytes are 0.1 was 0.2 consider reducing Gilenya to every other day.   2.   Gabapentin for the dysesthesias associated with a lateral femoral cutaneous nerve  3.   stay active and exercise as tolerated. 3.   Return in 6 months or sooner for new or worsening neurologic symptoms.

## 2020-11-11 LAB — CBC WITH DIFFERENTIAL/PLATELET
Basophils Absolute: 0 10*3/uL (ref 0.0–0.2)
Basos: 1 %
EOS (ABSOLUTE): 0.1 10*3/uL (ref 0.0–0.4)
Eos: 1 %
Hematocrit: 44 % (ref 34.0–46.6)
Hemoglobin: 14.2 g/dL (ref 11.1–15.9)
Immature Grans (Abs): 0 10*3/uL (ref 0.0–0.1)
Immature Granulocytes: 0 %
Lymphocytes Absolute: 0.3 10*3/uL — ABNORMAL LOW (ref 0.7–3.1)
Lymphs: 4 %
MCH: 29.3 pg (ref 26.6–33.0)
MCHC: 32.3 g/dL (ref 31.5–35.7)
MCV: 91 fL (ref 79–97)
Monocytes Absolute: 0.8 10*3/uL (ref 0.1–0.9)
Monocytes: 14 %
Neutrophils Absolute: 4.7 10*3/uL (ref 1.4–7.0)
Neutrophils: 80 %
Platelets: 286 10*3/uL (ref 150–450)
RBC: 4.84 x10E6/uL (ref 3.77–5.28)
RDW: 13.1 % (ref 11.7–15.4)
WBC: 5.9 10*3/uL (ref 3.4–10.8)

## 2020-11-11 LAB — HEPATIC FUNCTION PANEL
ALT: 19 IU/L (ref 0–32)
AST: 14 IU/L (ref 0–40)
Albumin: 4.5 g/dL (ref 3.8–4.9)
Alkaline Phosphatase: 102 IU/L (ref 44–121)
Bilirubin Total: 0.3 mg/dL (ref 0.0–1.2)
Bilirubin, Direct: <0.1 mg/dL (ref 0.00–0.40)
Total Protein: 6.6 g/dL (ref 6.0–8.5)

## 2020-11-15 ENCOUNTER — Other Ambulatory Visit: Payer: Self-pay | Admitting: *Deleted

## 2020-11-15 MED ORDER — TOPIRAMATE 25 MG PO TABS: ORAL_TABLET | ORAL | 0 refills | Status: DC

## 2020-12-20 ENCOUNTER — Other Ambulatory Visit: Payer: Self-pay | Admitting: *Deleted

## 2020-12-20 ENCOUNTER — Other Ambulatory Visit: Payer: Self-pay | Admitting: Neurology

## 2020-12-20 MED ORDER — LIDOCAINE 5 % EX PTCH: MEDICATED_PATCH | CUTANEOUS | 5 refills | Status: DC

## 2020-12-20 MED ORDER — LIDOCAINE 5 % EX PTCH: 1.0000 | MEDICATED_PATCH | Freq: Every day | CUTANEOUS | 0 refills | Status: DC | PRN

## 2020-12-21 ENCOUNTER — Telehealth: Payer: Self-pay | Admitting: *Deleted

## 2020-12-21 NOTE — Telephone Encounter (Signed)
Submitted PA on CMM. Key: [id number]. Received instant approval: "CaseId:[id number];Status:Approved;Review Type:Prior Auth;Coverage Start Date:11/21/2020;Coverage End Date:12/21/2021;"

## 2021-01-06 ENCOUNTER — Other Ambulatory Visit: Payer: Self-pay | Admitting: Neurology

## 2021-01-06 DIAGNOSIS — G35 Multiple sclerosis: Secondary | ICD-10-CM

## 2021-04-12 ENCOUNTER — Other Ambulatory Visit: Payer: Self-pay | Admitting: *Deleted

## 2021-04-12 MED ORDER — TOPIRAMATE 25 MG PO TABS: ORAL_TABLET | ORAL | 1 refills | Status: DC

## 2021-04-19 ENCOUNTER — Telehealth: Payer: Self-pay | Admitting: Neurology

## 2021-04-19 NOTE — Telephone Encounter (Signed)
I called and lvm for patient letting her know her appointment changed from the afternoon to 9am the same day since our office is closing early. Advised her to call us back if the new time does not work for her. Also sent mychart message.

## 2021-04-20 ENCOUNTER — Telehealth: Payer: Self-pay | Admitting: Neurology

## 2021-04-20 NOTE — Telephone Encounter (Signed)
..   Pt understands that although there may be some limitations with this type of visit, we will take all precautions to reduce any security or privacy concerns.  Pt understands that this will be treated like an in office visit and we will file with pt's insurance, and there may be a patient responsible charge related to this service. ? ?

## 2021-05-04 ENCOUNTER — Ambulatory Visit: Payer: Managed Care, Other (non HMO) | Admitting: Neurology

## 2021-05-23 ENCOUNTER — Telehealth: Payer: Managed Care, Other (non HMO) | Admitting: Neurology

## 2021-05-27 ENCOUNTER — Encounter: Payer: Self-pay | Admitting: Neurology

## 2021-06-01 ENCOUNTER — Telehealth: Payer: Self-pay | Admitting: *Deleted

## 2021-06-01 NOTE — Telephone Encounter (Signed)
PA Gilenya submitted on CMM. Key: [id number]. Waiting on determination from [employer].

## 2021-06-01 NOTE — Telephone Encounter (Signed)
CaseId:[id number];Status:Approved;Review Type:Prior Auth;Coverage Start Date:05/21/2021;Coverage End Date:06/19/2022;

## 2021-06-22 ENCOUNTER — Telehealth (INDEPENDENT_AMBULATORY_CARE_PROVIDER_SITE_OTHER): Payer: BC Managed Care – PPO | Admitting: Neurology

## 2021-06-22 ENCOUNTER — Encounter: Payer: Self-pay | Admitting: Neurology

## 2021-06-22 DIAGNOSIS — Z170 Estrogen receptor positive status [ER+]: Secondary | ICD-10-CM

## 2021-06-22 DIAGNOSIS — Z79899 Other long term (current) drug therapy: Secondary | ICD-10-CM

## 2021-06-22 DIAGNOSIS — G43009 Migraine without aura, not intractable, without status migrainosus: Secondary | ICD-10-CM

## 2021-06-22 DIAGNOSIS — C50912 Malignant neoplasm of unspecified site of left female breast: Secondary | ICD-10-CM

## 2021-06-22 DIAGNOSIS — G35 Multiple sclerosis: Secondary | ICD-10-CM

## 2021-06-22 NOTE — Progress Notes (Signed)
[facility]  PATIENT: Shelly Boyd DOB: 06/06/1960  REFERRING DOCTOR OR PCP:  Dr. Maye Hides, PCP is [name] SOURCE: patient, notes rom Dr. Starleen Blue, lab/imaging results, MRI images on CD/PACS  _________________________________   HISTORICAL  HISTORY OF PRESENT ILLNESS:  Shelly Boyd is a 61 y.o. woman who was diagnosed with relapsing remitting MS in 2012  Virtual Visit via Video Note I connected with Jessia Kief  on 06/22/21 at  3:30 PM EST by a video enabled telemedicine application and verified that I am speaking with the correct person.  I discussed the limitations of evaluation and management by telemedicine and the availability of in person appointments. The patient expressed understanding and agreed to proceed.  Patient was in her home.  Doctor was in the office.  Update 06/22/2021: She is on Gilenya and tolerates it well.  She has no exacerbations.  She has had more muscle spasms but no other new issues.     Her lymphocyte counts were 0.3 in 03/2021.   We discussed that low lymphocytes counts are common and related to mechanism.   If counts were 0.1 we would lower to qod.    Balance is slightly off but gait is baseline.  She uses the rail going down but not upstairs.   She feels she is walking well in general.  She can walk 2 mile without stopping.   She denies muscle weakness but has had some intermittent spasms in her legs, especially if tired.    She denies numbness.   She has had some UTI's and takes macrobid when one occurs.   She is going to see a urologist and get cystoscopy and urodynamic.      She notes some fatigue but no worse.  Her husband was in the hospital x 35 days and still needs a lot of help (they are trying to get him inpt rehab but he is back home).   He had a SDH and needed burr holes x 2 and a craniotomy.    She is sleeping well.   She feels a little more stress with her husband's health and has been tearful a few times but denies  depression.    Cognition is doing well.  She sleeps well most nights.    Migraines are generally doing well.  She only has with weather change.    She takes topamax   Imitrex helps but seems to cause joint pain.      She had breast cancer and had surgery earlier this year and had RadRx.  She has some scar tissue.  Lymph nodes were clear.    She is on anastrozole.  No recurrence  She had right leg/hip pain consistent with lateral femoral cutaneous neuropathy.   Gabapentin caused swelling.  Lidoderm patch has helped.   Physical Exam She is a well-developed well-nourished woman in no acute distress.  The head is normocephalic and atraumatic.  Sclera are anicteric.  Visible skin appears normal.  The neck has a good range of motion.  She is alert and fully oriented with fluent speech and good attention, knowledge and memory.  Extraocular muscles are intact.  Facial strength is normal.  She appears to have normal strength in the arms.  Rapid alternating movements and finger-nose-finger are performed well.   MS History:   She was diagnosed with MS in 2012 after presenting with pain and numbness in the right arm and blurry vision. Her ophthalmologist diagnosed her with nystagmus. She was referred  to Dr. Starleen Blue in advance and was diagnosed with MS after an MRI. Combination of her symptoms and classic MRI were sufficient to diagnose MS without the need to undergo a lumbar puncture. She received 3 days of IV Solu-Medrol and was back to baseline in 3 weeks..  Initially, she was placed on Betaseron. She had a rash with that and was switched to Tysabri. After 42 infusions, she switched due to a high JCV antibody titer (0.66).  Since February 2017 she has been on Gilenya. She tolerates it well and has not had any exacerbations.    Her last MRI of the brain was performed December 2017. Personally reviewed the images. She she has multiple T2/FLAIR hyperintense foci, predominantly in the periventricular white matter.  Most are radially oriented to the ventricles and some are hypointense on T1-weighted images. None of the foci enhanced. There were no acute findings.     Imaging: MRI of the brain 11/25/2018 showed Multiple T2/FLAIR hyperintense foci predominantly in the periventricular white matter of the hemispheres in a pattern and configuration consistent with chronic demyelinating plaque associated with multiple sclerosis.  None of the foci enhance or appear to be acute.  Compared to the MRI dated 08/22/2017, there do not appear to be any new lesions.      There is a normal enhancement pattern and no acute findings  MRI of the brain 08/22/2017 showed Multiple T2/FLAIR hyperintense foci, predominantly in the periventricular white matter in a pattern and configuration consistent with chronic demyelinating plaque associated with multiple sclerosis.  None of the foci appears to be acute.  When compared to the MRI dated 04/26/2016, there is no interim change.  REVIEW OF SYSTEMS: Constitutional: No fevers, chills, sweats, or change in appetite.   She reports fatigue.   Has gained a few pounds over the past year.   Eyes: No visual changes, double vision, eye pain Ear, nose and throat: No hearing loss, ear pain, nasal congestion, sore throat Cardiovascular: No chest pain.  She has had palpitations Respiratory:  No shortness of breath at rest or with exertion.   No wheezes.   Snores, possible OSA. GastrointestinaI: No nausea, vomiting, diarrhea, abdominal pain, fecal incontinence Genitourinary:  No dysuria, urinary retention or frequency.  2 x  nocturia. Musculoskeletal:  No neck pain, back pain Integumentary: No rash, pruritus, skin lesions Neurological: as above Psychiatric: No depression at this time.  No anxiety Endocrine: She sees endocrinology for a goiter and subclinical hyperthyroidism (Dr. Tawnya Crook).  No diaphoresis, change in appetite, change in weigh or increased thirst Hematologic/Lymphatic:  No anemia, purpura,  petechiae. Allergic/Immunologic: No itchy/runny eyes, nasal congestion, recent allergic reactions, rashes  ALLERGIES: Allergies  Allergen Reactions   Benadryl [Diphenhydramine Hcl]     Pass out/seizure activity    Gabapentin     Swelling, SOB, back pain   Betaseron [Interferon Beta-1b] Rash    HOME MEDICATIONS:  Current Outpatient Medications:    anastrozole (ARIMIDEX) 1 MG tablet, Take 1 mg by mouth daily., Disp: , Rfl:    Ascorbic Acid (VITAMIN C) 1000 MG tablet, Take 1,000 mg by mouth daily. 2x per week, Disp: , Rfl:    Esomeprazole Magnesium (NEXIUM PO), Take 1 Dose by mouth daily., Disp: , Rfl:    GILENYA 0.5 MG CAPS, TAKE 1 CAPSULE DAILY., Disp: 30 capsule, Rfl: 11   ibuprofen (ADVIL,MOTRIN) 200 MG tablet, Take by mouth. PRN, Disp: , Rfl:    Lactobacillus Rhamnosus, GG, (CULTURELLE PO), Take by mouth 2 (two) times daily.,  Disp: , Rfl:    lidocaine (LIDODERM) 5 %, Apply one patch to affected thigh daily as needed. Remove after 12-14 hours., Disp: 30 patch, Rfl: 5   Magnesium Gluconate 550 MG TABS, Take 400 mg by mouth., Disp: , Rfl:    methIMAzole (TAPAZOLE PO), Take 15 mg by mouth daily., Disp: , Rfl:    SUMAtriptan (IMITREX) 50 MG tablet, Take 1 tablet (50 mg total) by mouth every 2 (two) hours as needed for migraine. May repeat in 2 hours if headache persists or recurs., Disp: 30 tablet, Rfl: 3   topiramate (TOPAMAX) 25 MG tablet, Take one tablet in the morning and 3 tablets at night, Disp: 360 tablet, Rfl: 1   Turmeric 500 MG TABS, Take 1,000 mg by mouth daily. , Disp: , Rfl:    VITAMIN D, ERGOCALCIFEROL, PO, Take 5,000 Units by mouth 2 (two) times daily. , Disp: , Rfl:   PAST MEDICAL HISTORY: Past Medical History:  Diagnosis Date   Anxiety    Bell's palsy    Hypertension    Migraine    MS (multiple sclerosis) ([place])    Seizures ([place])    Thyroid disease    Vision abnormalities     PAST SURGICAL HISTORY: Past Surgical History:  Procedure Laterality Date    ENDOMETRIAL ABLATION     TUBAL LIGATION      FAMILY HISTORY: Family History  Problem Relation Age of Onset   Dementia Mother    Osteoarthritis Mother    Transient ischemic attack Mother    Hypertension Mother    Heart disease Father    Hypertension Father    Diabetes Mellitus II Father    Migraines Sister    Heart attack Brother    Colon cancer Brother    Diverticulitis Brother    Healthy Brother    Tremor Brother    Healthy Brother      ___________________________________  Multiple sclerosis ([place])  High risk medication use  Migraine without aura and without status migrainosus, not intractable  Ductal carcinoma of breast, estrogen receptor positive, stage 2, left ([place])   1.   Continue Gilenya.  Recent lab work was fine..   2.   Continue lidocaine patch for lateral femoral cutaneous nerve 3.   stay active and exercise as tolerated. 4.   Return in 6 months or sooner for new or worsening neurologic symptoms.   Follow Up Instructions: I discussed the assessment and treatment plan with the patient. The patient was provided an opportunity to ask questions and all were answered. The patient agreed with the plan and demonstrated an understanding of the instructions.    The patient was advised to call back or seek an in-person evaluation if the symptoms worsen or if the condition fails to improve as anticipated.  I provided 28 minutes of non-face-to-face time during this encounter.

## 2021-08-24 ENCOUNTER — Encounter: Payer: Self-pay | Admitting: Neurology

## 2021-10-23 ENCOUNTER — Other Ambulatory Visit: Payer: Self-pay

## 2021-10-23 MED ORDER — TOPIRAMATE 25 MG PO TABS: ORAL_TABLET | ORAL | 1 refills | Status: DC

## 2021-12-26 ENCOUNTER — Other Ambulatory Visit: Payer: Self-pay | Admitting: Neurology

## 2021-12-26 ENCOUNTER — Ambulatory Visit: Payer: BC Managed Care – PPO | Admitting: Neurology

## 2021-12-26 DIAGNOSIS — G35 Multiple sclerosis: Secondary | ICD-10-CM

## 2022-01-09 ENCOUNTER — Ambulatory Visit: Payer: BC Managed Care – PPO | Admitting: Neurology

## 2022-01-09 ENCOUNTER — Encounter: Payer: Self-pay | Admitting: Neurology

## 2022-01-09 VITALS — BP 163/109 | HR 98 | Ht 64.0 in | Wt 228.0 lb

## 2022-01-09 DIAGNOSIS — C50912 Malignant neoplasm of unspecified site of left female breast: Secondary | ICD-10-CM

## 2022-01-09 DIAGNOSIS — E559 Vitamin D deficiency, unspecified: Secondary | ICD-10-CM | POA: Diagnosis not present

## 2022-01-09 DIAGNOSIS — G35 Multiple sclerosis: Secondary | ICD-10-CM

## 2022-01-09 DIAGNOSIS — G5711 Meralgia paresthetica, right lower limb: Secondary | ICD-10-CM

## 2022-01-09 DIAGNOSIS — Z79899 Other long term (current) drug therapy: Secondary | ICD-10-CM

## 2022-01-09 DIAGNOSIS — Z170 Estrogen receptor positive status [ER+]: Secondary | ICD-10-CM

## 2022-01-09 NOTE — Progress Notes (Unsigned)
[facility]  PATIENT: Shelly Boyd DOB: 01/25/1961  REFERRING DOCTOR OR PCP:  Dr. Maye Hides, PCP is [name] SOURCE: patient, notes rom Dr. Starleen Blue, lab/imaging results, MRI images on CD/PACS  _________________________________   HISTORICAL  HISTORY OF PRESENT ILLNESS:  Shelly Boyd is a 61 y.o. woman who was diagnosed with relapsing remitting MS in 2012 Chief Complaint  Patient presents with   Follow-up    RM1, alone. Doing well on fingolimod. Having some migraines but CoQ10 seems to help.     Update 01/09/2022: She is on fingolimod and tolerates it well.   Her lymphocyte counts are often 0.2-0.3 , none in last 6 months .   We discussed that low lymphocytes counts are common and related to mechanism.   If counts were 0.1 we would lower to qod.    She is walking well and as no recent falls.   eels she is walking well in general but had one fall when her toe caught on a dent in the floor.  She can walk 2 mile without stopping.  She   She denies muscle weakness or spasticity.   She denies numbness.   She sees urology now for urinary changes and had urodynamic testing and was noted to have good muscle pressure.    Cystoscopy was also fine.  No recent UTI.  Vision is fine.    She notes some fatigue that varies.   She tries to stay active and works 40 hours.    She is sleeping ok - some sleep onset insomnia - and she averages 6-6 1/2 hrs nightly. .   She has been tearful a few times but denies depression.    Cognition is doing well.  She is noting some stress with husband's illness and work.   She denies depression and has mild anxiety.   She sleeps well most nights.    She was diagnosed with breast cancer and had surgery earlier this year and had RadRx in 2021/2022.  Marland KitchenLymph nodes were clear.    She is on anastrozole.    She has some migraines, often triggered by changes in barometric pressure.  She takes topamax   Imitrex helps but seems to cause joint pain.       She has lost 15 pounds on Weight Watchers, doing with husband which has helped.    MS History:   She was diagnosed with MS in 2012 after presenting with pain and numbness in the right arm and blurry vision. Her ophthalmologist diagnosed her with nystagmus. She was referred to Dr. Starleen Blue in advance and was diagnosed with MS after an MRI. Combination of her symptoms and classic MRI were sufficient to diagnose MS without the need to undergo a lumbar puncture. She received 3 days of IV Solu-Medrol and was back to baseline in 3 weeks..  Initially, she was placed on Betaseron. She had a rash with that and was switched to Tysabri. After 42 infusions, she switched due to a high JCV antibody titer (0.66).  Since February 2017 she has been on Gilenya. She tolerates it well and has not had any exacerbations.    Her last MRI of the brain was performed December 2017. Personally reviewed the images. She she has multiple T2/FLAIR hyperintense foci, predominantly in the periventricular white matter. Most are radially oriented to the ventricles and some are hypointense on T1-weighted images. None of the foci enhanced. There were no acute findings.     Imaging: MRI of the brain  11/25/2018 showed Multiple T2/FLAIR hyperintense foci predominantly in the periventricular white matter of the hemispheres in a pattern and configuration consistent with chronic demyelinating plaque associated with multiple sclerosis.  None of the foci enhance or appear to be acute.  Compared to the MRI dated 08/22/2017, there do not appear to be any new lesions.      There is a normal enhancement pattern and no acute findings  MRI of the brain 08/22/2017 showed Multiple T2/FLAIR hyperintense foci, predominantly in the periventricular white matter in a pattern and configuration consistent with chronic demyelinating plaque associated with multiple sclerosis.  None of the foci appears to be acute.  When compared to the MRI dated 04/26/2016, there is no  interim change.  REVIEW OF SYSTEMS: Constitutional: No fevers, chills, sweats, or change in appetite.   She reports fatigue.   Has gained a few pounds over the past year.   Eyes: No visual changes, double vision, eye pain Ear, nose and throat: No hearing loss, ear pain, nasal congestion, sore throat Cardiovascular: No chest pain.  She has had palpitations Respiratory:  No shortness of breath at rest or with exertion.   No wheezes.   Snores, possible OSA. GastrointestinaI: No nausea, vomiting, diarrhea, abdominal pain, fecal incontinence Genitourinary:  No dysuria, urinary retention or frequency.  2 x  nocturia. Musculoskeletal:  No neck pain, back pain Integumentary: No rash, pruritus, skin lesions Neurological: as above Psychiatric: No depression at this time.  No anxiety Endocrine: She sees endocrinology for a goiter and subclinical hyperthyroidism (Dr. Tawnya Crook).  No diaphoresis, change in appetite, change in weigh or increased thirst Hematologic/Lymphatic:  No anemia, purpura, petechiae. Allergic/Immunologic: No itchy/runny eyes, nasal congestion, recent allergic reactions, rashes  ALLERGIES: Allergies  Allergen Reactions   Benadryl [Diphenhydramine Hcl]     Pass out/seizure activity    Gabapentin     Swelling, SOB, back pain   Betaseron [Interferon Beta-1b] Rash    HOME MEDICATIONS:  Current Outpatient Medications:    anastrozole (ARIMIDEX) 1 MG tablet, Take 1 mg by mouth daily., Disp: , Rfl:    Ascorbic Acid (VITAMIN C) 1000 MG tablet, Take 1,000 mg by mouth daily. 2x per week, Disp: , Rfl:    Coenzyme Q10 60 MG CAPS, Take by mouth., Disp: , Rfl:    famotidine (PEPCID) 20 MG tablet, Take 20 mg by mouth in the morning and at bedtime., Disp: , Rfl:    [START ON 01/19/2022] Fingolimod HCl 0.5 MG CAPS, Take 1 capsule (0.5 mg total) by mouth daily., Disp: 30 capsule, Rfl: 5   ibuprofen (ADVIL,MOTRIN) 200 MG tablet, Take by mouth. PRN, Disp: , Rfl:    Lactobacillus Rhamnosus, GG,  (CULTURELLE PO), Take by mouth 2 (two) times daily., Disp: , Rfl:    lidocaine (LIDODERM) 5 %, Apply one patch to affected thigh daily as needed. Remove after 12-14 hours., Disp: 30 patch, Rfl: 5   Magnesium Gluconate 550 MG TABS, Take 400 mg by mouth., Disp: , Rfl:    methimazole (TAPAZOLE) 10 MG tablet, Take 10 mg by mouth daily., Disp: , Rfl:    SUMAtriptan (IMITREX) 50 MG tablet, Take 1 tablet (50 mg total) by mouth every 2 (two) hours as needed for migraine. May repeat in 2 hours if headache persists or recurs., Disp: 30 tablet, Rfl: 3   topiramate (TOPAMAX) 25 MG tablet, Take one tablet in the morning and 3 tablets at night (Patient taking differently: Take one tablet in the morning and 2 tablets at night), Disp:  360 tablet, Rfl: 1   Turmeric 500 MG TABS, Take 1,000 mg by mouth daily. , Disp: , Rfl:    VITAMIN D, ERGOCALCIFEROL, PO, Take 5,000 Units by mouth 3 (three) times a week., Disp: , Rfl:   PAST MEDICAL HISTORY: Past Medical History:  Diagnosis Date   Anxiety    Bell's palsy    Hypertension    Migraine    MS (multiple sclerosis) ([place])    Seizures ([place])    Thyroid disease    Vision abnormalities     PAST SURGICAL HISTORY: Past Surgical History:  Procedure Laterality Date   ENDOMETRIAL ABLATION     TUBAL LIGATION      FAMILY HISTORY: Family History  Problem Relation Age of Onset   Dementia Mother    Osteoarthritis Mother    Transient ischemic attack Mother    Hypertension Mother    Heart disease Father    Hypertension Father    Diabetes Mellitus II Father    Migraines Sister    Heart attack Brother    Colon cancer Brother    Diverticulitis Brother    Healthy Brother    Tremor Brother    Healthy Brother     SOCIAL HISTORY:  Social History   Socioeconomic History   Marital status: Married    Spouse name: Not on file   Number of children: Not on file   Years of education: Not on file   Highest education level: Not on file  Occupational History   Not  on file  Tobacco Use   Smoking status: Never   Smokeless tobacco: Never  Substance and Sexual Activity   Alcohol use: No   Drug use: Not on file   Sexual activity: Not on file  Other Topics Concern   Not on file  Social History Narrative   Not on file   Social Determinants of Health   Financial Resource Strain: Not on file  Food Insecurity: Not on file  Transportation Needs: Not on file  Physical Activity: Not on file  Stress: Not on file  Social Connections: Not on file  Intimate Partner Violence: Not on file     PHYSICAL EXAM  Vitals:   01/09/22 1551  BP: (!) 163/109  Pulse: 98  Weight: 228 lb (103.4 kg)  Height: '5\' 4"'$  (1.626 m)   Repeat BP 145/85  Body mass index is 39.14 kg/m.   General: The patient is well-developed and well-nourished and in no acute distress.   Neck is nontender with good ROM   Neurologic Exam  Mental status: The patient is alert and oriented x 3 at the time of the examination. The patient has apparent normal recent and remote memory, with an apparently normal attention span and concentration ability.   Speech is normal.  Cranial nerves: Extraocular movements are full. Facial strength and sensation are normal.   Trapezius strength is normal.    The tongue is midline, and the patient has symmetric elevation of the soft palate. No obvious hearing deficits are noted.  Motor:  Muscle bulk is normal.   Tone is normal in the arms but mildly increased in the right leg. .   Strength is  5 / 5 in all 4 extremities.   Sensory: She has normal touch sensation in arms ad leg now  Coordination: Finger-nose-finger and heel-to-shin is performed well.  Gait and station: Station is normal.   The gait is normal.  Tandem gait is slightly wide.  Romberg is negative.Marland Kitchen  Reflexes: Deep tendon reflexes are symmetric and normal bilaterally.     ___________________________________  Multiple sclerosis ([place]) - Plan: CBC with Differential/Platelet, Hepatic  function panel, MR BRAIN W WO CONTRAST, CANCELED: MR BRAIN WO CONTRAST  High risk medication use - Plan: CBC with Differential/Platelet, Hepatic function panel, CANCELED: MR BRAIN WO CONTRAST  Vitamin D deficiency - Plan: VITAMIN D 25 Hydroxy (Vit-D Deficiency, Fractures)  Lateral femoral cutaneous entrapment syndrome, right  Ductal carcinoma of breast, estrogen receptor positive, stage 2, left (HCC) - Plan: MR BRAIN W WO CONTRAST   1.   Continue Gilenya.  Check labs.  If the lymphocytes are 0.1 we will reduce Gilenya to every other day.   2.   Continue topiramate for migraines.  Try to lose more weight with diet.   3.   stay active and exercise as tolerated. 3.   Return in 6 months or sooner for new or worsening neurologic symptoms.

## 2022-01-10 LAB — CBC WITH DIFFERENTIAL/PLATELET
Basophils Absolute: 0 10*3/uL (ref 0.0–0.2)
Basos: 1 %
EOS (ABSOLUTE): 0 10*3/uL (ref 0.0–0.4)
Eos: 1 %
Hematocrit: 42.6 % (ref 34.0–46.6)
Hemoglobin: 14.1 g/dL (ref 11.1–15.9)
Immature Grans (Abs): 0 10*3/uL (ref 0.0–0.1)
Immature Granulocytes: 0 %
Lymphocytes Absolute: 0.2 10*3/uL — ABNORMAL LOW (ref 0.7–3.1)
Lymphs: 5 %
MCH: 29.7 pg (ref 26.6–33.0)
MCHC: 33.1 g/dL (ref 31.5–35.7)
MCV: 90 fL (ref 79–97)
Monocytes Absolute: 0.7 10*3/uL (ref 0.1–0.9)
Monocytes: 15 %
Neutrophils Absolute: 3.4 10*3/uL (ref 1.4–7.0)
Neutrophils: 78 %
Platelets: 224 10*3/uL (ref 150–450)
RBC: 4.75 x10E6/uL (ref 3.77–5.28)
RDW: 13.2 % (ref 11.7–15.4)
WBC: 4.4 10*3/uL (ref 3.4–10.8)

## 2022-01-10 LAB — HEPATIC FUNCTION PANEL
ALT: 62 IU/L — ABNORMAL HIGH (ref 0–32)
AST: 29 IU/L (ref 0–40)
Albumin: 4.7 g/dL (ref 3.8–4.9)
Alkaline Phosphatase: 106 IU/L (ref 44–121)
Bilirubin Total: 0.3 mg/dL (ref 0.0–1.2)
Bilirubin, Direct: 0.13 mg/dL (ref 0.00–0.40)
Total Protein: 6.4 g/dL (ref 6.0–8.5)

## 2022-01-10 LAB — VITAMIN D 25 HYDROXY (VIT D DEFICIENCY, FRACTURES): Vit D, 25-Hydroxy: 56.4 ng/mL (ref 30.0–100.0)

## 2022-01-19 ENCOUNTER — Telehealth: Payer: Self-pay | Admitting: Neurology

## 2022-01-19 NOTE — Telephone Encounter (Signed)
[name]: [id number] exp. 01/19/22-02/17/22 sent to [facility] for pacemaker

## 2022-01-26 ENCOUNTER — Encounter: Payer: Self-pay | Admitting: Neurology

## 2022-01-27 ENCOUNTER — Other Ambulatory Visit: Payer: Self-pay | Admitting: Neurology

## 2022-01-29 ENCOUNTER — Telehealth: Payer: Self-pay | Admitting: Neurology

## 2022-01-29 NOTE — Telephone Encounter (Signed)
Pt scheduled for MRI brain w/wo contrast at [facility] on 02/07/22 at 3:30 pm.  [employer]: [id number] (01/29/22-02/27/22)

## 2022-01-29 NOTE — Telephone Encounter (Signed)
[facility]) MRI brain with or without contrast has been approved schedule with [facility]. Authorization no: [id number]

## 2022-01-31 ENCOUNTER — Telehealth: Payer: Self-pay | Admitting: *Deleted

## 2022-01-31 NOTE — Telephone Encounter (Signed)
Submitted PA lidocaine patch on CMM. Key: [id number]. Received instant approval: "CaseId:[id number];Status:Approved;Review Type:Prior Auth;Coverage Start Date:01/01/2022;Coverage End Date:01/31/2023;"

## 2022-02-07 ENCOUNTER — Ambulatory Visit: Payer: BC Managed Care – PPO

## 2022-02-07 DIAGNOSIS — G35 Multiple sclerosis: Secondary | ICD-10-CM

## 2022-02-07 DIAGNOSIS — C50912 Malignant neoplasm of unspecified site of left female breast: Secondary | ICD-10-CM | POA: Diagnosis not present

## 2022-02-07 DIAGNOSIS — Z170 Estrogen receptor positive status [ER+]: Secondary | ICD-10-CM

## 2022-02-07 MED ORDER — GADOBENATE DIMEGLUMINE 529 MG/ML IV SOLN
20.0000 mL | Freq: Once | INTRAVENOUS | Status: AC | PRN
  Administered 2022-02-07: 20 mL via INTRAVENOUS

## 2022-04-23 ENCOUNTER — Other Ambulatory Visit: Payer: Self-pay

## 2022-04-23 MED ORDER — TOPIRAMATE 25 MG PO TABS: ORAL_TABLET | ORAL | 1 refills | Status: DC

## 2022-04-30 ENCOUNTER — Encounter: Payer: Self-pay | Admitting: Neurology

## 2022-05-01 ENCOUNTER — Other Ambulatory Visit: Payer: Self-pay

## 2022-05-01 ENCOUNTER — Encounter: Payer: Self-pay | Admitting: *Deleted

## 2022-05-01 DIAGNOSIS — G35 Multiple sclerosis: Secondary | ICD-10-CM

## 2022-05-01 MED ORDER — FINGOLIMOD HCL 0.5 MG PO CAPS: 1.0000 | ORAL_CAPSULE | Freq: Every day | ORAL | 5 refills | Status: DC

## 2022-07-17 ENCOUNTER — Encounter: Payer: Self-pay | Admitting: Neurology

## 2022-07-17 ENCOUNTER — Telehealth (INDEPENDENT_AMBULATORY_CARE_PROVIDER_SITE_OTHER): Payer: BC Managed Care – PPO | Admitting: Neurology

## 2022-07-17 DIAGNOSIS — G43009 Migraine without aura, not intractable, without status migrainosus: Secondary | ICD-10-CM | POA: Diagnosis not present

## 2022-07-17 DIAGNOSIS — R269 Unspecified abnormalities of gait and mobility: Secondary | ICD-10-CM

## 2022-07-17 DIAGNOSIS — Z79899 Other long term (current) drug therapy: Secondary | ICD-10-CM

## 2022-07-17 DIAGNOSIS — G35 Multiple sclerosis: Secondary | ICD-10-CM

## 2022-07-17 DIAGNOSIS — C50912 Malignant neoplasm of unspecified site of left female breast: Secondary | ICD-10-CM

## 2022-07-17 DIAGNOSIS — Z170 Estrogen receptor positive status [ER+]: Secondary | ICD-10-CM

## 2022-07-17 MED ORDER — SUMATRIPTAN SUCCINATE 50 MG PO TABS: ORAL_TABLET | ORAL | 3 refills | Status: AC

## 2022-07-17 MED ORDER — IMIPRAMINE HCL 25 MG PO TABS
25.0000 mg | ORAL_TABLET | Freq: Every day | ORAL | 3 refills | Status: DC
Start: 2022-07-17 — End: 2023-08-20

## 2022-07-17 MED ORDER — FINGOLIMOD HCL 0.5 MG PO CAPS: 1.0000 | ORAL_CAPSULE | Freq: Every day | ORAL | 11 refills | Status: DC

## 2022-07-17 NOTE — Progress Notes (Signed)
[facility]  PATIENT: Shelly Boyd DOB: 03-21-1961  REFERRING DOCTOR OR PCP:  Dr. Maye Hides, PCP is [name] SOURCE: patient, notes rom Dr. Starleen Blue, lab/imaging results, MRI images on CD/PACS  _________________________________   HISTORICAL  HISTORY OF PRESENT ILLNESS:  Shelly Boyd is a 62 y.o. woman who was diagnosed with relapsing remitting MS in 2012  Virtual Visit via Video Note I connected with Shelly Boyd  on 07/17/22 at  3:00 PM EST by a video enabled telemedicine application and verified that I am speaking with the correct person.  I discussed the limitations of evaluation and management by telemedicine and the availability of in person appointments. The patient expressed understanding and agreed to proceed.  Patient was in her home.  Doctor was in the office.  Update 07/17/2022: She is on fingolimod and tolerates it well.   Her lymphocyte counts are sometimes 0.2-0.3 , 0.3 in last labs in Jan 2024 .   We discussed that low lymphocytes counts are common and related to mechanism but risk of infection may increase with age on fingolimod.  She is walking well and as no recent falls.   She tries to walk some throughout the day.    She can walk 2 mile without stopping.  She   She denies muscle weakness or spasticity.   She denies numbness.   She sees urology now for urinary changes and had urodynamic testing and was noted to have good muscle pressure.   No recent UTI.  Vision is fine.    She notes some fatigue but no more than last year.   She tries to stay active and works 40 hours.     Cognition is doing well.   She denies depression now.   She sleeps well most nights.    She was diagnosed with breast cancer and had surgery earlier this year and had RadRx in 2021/2022.  Marland KitchenLymph nodes were clear.    She is on anastrozole.    She will be doing a bone density soon.    She has mild lymhedema and will be going to the clinic.   She will be doing wraps.    She  has some migraines, often triggered by changes in barometric pressure.  She takes topamax   Imitrex helps but seems to cause joint pain.    Generally they are rare - only a few the past year.     She has lost 40 pounds on Weight Watchers, doing with husband which has helped.      Physical Exam She is a well-developed well-nourished woman in no acute distress.  The head is normocephalic and atraumatic.  Sclera are anicteric.  Visible skin appears normal.  The neck has a good range of motion.  She is alert and fully oriented with fluent speech and good attention, knowledge and memory.  Extraocular muscles are intact.  Facial strength is normal.  She appears to have normal strength in the arms. Good shoulder ROM Rapid alternating movements and finger-nose-finger are performed well.   MS History:   She was diagnosed with MS in 2012 after presenting with pain and numbness in the right arm and blurry vision. Her ophthalmologist diagnosed her with nystagmus. She was referred to Dr. Starleen Blue in advance and was diagnosed with MS after an MRI. Combination of her symptoms and classic MRI were sufficient to diagnose MS without the need to undergo a lumbar puncture. She received 3 days of IV Solu-Medrol and was back to  baseline in 3 weeks..  Initially, she was placed on Betaseron. She had a rash with that and was switched to Tysabri. After 42 infusions, she switched due to a high JCV antibody titer (0.66).  Since February 2017 she has been on Gilenya. She tolerates it well and has not had any exacerbations.    Her last MRI of the brain was performed December 2017. Personally reviewed the images. She she has multiple T2/FLAIR hyperintense foci, predominantly in the periventricular white matter. Most are radially oriented to the ventricles and some are hypointense on T1-weighted images. None of the foci enhanced. There were no acute findings.     Imaging: MRI brain 02/07/2022 showed Multiple T2/FLAIR hyperintense foci  predominantly in the periventricular of the white matter. 1 small focus is also noted in the left pons. None of the foci enhance. They do not appear to be acute. Compared to the MRI from 11/25/2018, there are no new lesions.   MRI of the brain 11/25/2018 showed Multiple T2/FLAIR hyperintense foci predominantly in the periventricular white matter of the hemispheres in a pattern and configuration consistent with chronic demyelinating plaque associated with multiple sclerosis.  None of the foci enhance or appear to be acute.  Compared to the MRI dated 08/22/2017, there do not appear to be any new lesions.      There is a normal enhancement pattern and no acute findings  MRI of the brain 08/22/2017 showed Multiple T2/FLAIR hyperintense foci, predominantly in the periventricular white matter in a pattern and configuration consistent with chronic demyelinating plaque associated with multiple sclerosis.  None of the foci appears to be acute.  When compared to the MRI dated 04/26/2016, there is no interim change.  REVIEW OF SYSTEMS: Constitutional: No fevers, chills, sweats, or change in appetite.   She reports fatigue.   Has gained a few pounds over the past year.   Eyes: No visual changes, double vision, eye pain Ear, nose and throat: No hearing loss, ear pain, nasal congestion, sore throat Cardiovascular: No chest pain.  She has had palpitations Respiratory:  No shortness of breath at rest or with exertion.   No wheezes.   Snores, possible OSA. GastrointestinaI: No nausea, vomiting, diarrhea, abdominal pain, fecal incontinence Genitourinary:  No dysuria, urinary retention or frequency.  2 x  nocturia. Musculoskeletal:  No neck pain, back pain Integumentary: No rash, pruritus, skin lesions Neurological: as above Psychiatric: No depression at this time.  No anxiety Endocrine: She sees endocrinology for a goiter and subclinical hyperthyroidism (Dr. Tawnya Crook).  No diaphoresis, change in appetite, change in weigh or  increased thirst Hematologic/Lymphatic:  No anemia, purpura, petechiae. Allergic/Immunologic: No itchy/runny eyes, nasal congestion, recent allergic reactions, rashes  ALLERGIES: Allergies  Allergen Reactions   Benadryl [Diphenhydramine Hcl]     Pass out/seizure activity    Gabapentin     Swelling, SOB, back pain   Betaseron [Interferon Beta-1b] Rash    HOME MEDICATIONS:  Current Outpatient Medications:    imipramine (TOFRANIL) 25 MG tablet, Take 1 tablet (25 mg total) by mouth at bedtime., Disp: 90 tablet, Rfl: 3   anastrozole (ARIMIDEX) 1 MG tablet, Take 1 mg by mouth daily., Disp: , Rfl:    Ascorbic Acid (VITAMIN C) 1000 MG tablet, Take 1,000 mg by mouth daily. 2x per week, Disp: , Rfl:    Coenzyme Q10 60 MG CAPS, Take by mouth., Disp: , Rfl:    famotidine (PEPCID) 20 MG tablet, Take 20 mg by mouth in the morning and at  bedtime., Disp: , Rfl:    Fingolimod HCl 0.5 MG CAPS, Take 1 capsule (0.5 mg total) by mouth daily., Disp: 30 capsule, Rfl: 11   ibuprofen (ADVIL,MOTRIN) 200 MG tablet, Take by mouth. PRN, Disp: , Rfl:    Lactobacillus Rhamnosus, GG, (CULTURELLE PO), Take by mouth 2 (two) times daily., Disp: , Rfl:    lidocaine (LIDODERM) 5 %, APPLY ONE PATCH TO AFFECTED THIGH DAILY AS NEEDED. REMOVE AFTER 12-14 HOURS., Disp: 30 patch, Rfl: 5   Magnesium Gluconate 550 MG TABS, Take 400 mg by mouth., Disp: , Rfl:    methimazole (TAPAZOLE) 10 MG tablet, Take 10 mg by mouth daily., Disp: , Rfl:    SUMAtriptan (IMITREX) 50 MG tablet, Take one as needed for migraine.   May repeat in 2 hours if headache persists or recurs., Disp: 30 tablet, Rfl: 3   topiramate (TOPAMAX) 25 MG tablet, Take one tablet in the morning and 3 tablets at night, Disp: 360 tablet, Rfl: 1   Turmeric 500 MG TABS, Take 1,000 mg by mouth daily. , Disp: , Rfl:    VITAMIN D, ERGOCALCIFEROL, PO, Take 5,000 Units by mouth 3 (three) times a week., Disp: , Rfl:   PAST MEDICAL HISTORY: Past Medical History:  Diagnosis  Date   Anxiety    Bell's palsy    Hypertension    Migraine    MS (multiple sclerosis) ([place])    Seizures ([place])    Thyroid disease    Vision abnormalities     PAST SURGICAL HISTORY: Past Surgical History:  Procedure Laterality Date   ENDOMETRIAL ABLATION     TUBAL LIGATION      FAMILY HISTORY: Family History  Problem Relation Age of Onset   Dementia Mother    Osteoarthritis Mother    Transient ischemic attack Mother    Hypertension Mother    Heart disease Father    Hypertension Father    Diabetes Mellitus II Father    Migraines Sister    Heart attack Brother    Colon cancer Brother    Diverticulitis Brother    Healthy Brother    Tremor Brother    Healthy Brother      ___________________________________  High risk medication use  Multiple sclerosis ([place]) - Plan: Fingolimod HCl 0.5 MG CAPS  Ductal carcinoma of breast, estrogen receptor positive, stage 2, left (HCC)  Migraine without aura and without status migrainosus, not intractable  Gait disturbance   1.   Continue fingolimod.  We will change to 0.5 mg qod as lymphocytes are usually 0.2-0.3  2.    She cannot tolerate gabapentin.  I will add imipramine 25 mg nightly for her dysesthetic pain. 3.   stay active and exercise as tolerated. 4.   Return in 6 months or sooner for new or worsening neurologic symptoms.   Follow Up Instructions: I discussed the assessment and treatment plan with the patient. The patient was provided an opportunity to ask questions and all were answered. The patient agreed with the plan and demonstrated an understanding of the instructions.    The patient was advised to call back or seek an in-person evaluation if the symptoms worsen or if the condition fails to improve as anticipated.  I provided 27 minutes of non-face-to-face time during this encounter.

## 2022-07-18 ENCOUNTER — Telehealth: Payer: Self-pay | Admitting: Neurology

## 2022-07-18 NOTE — Telephone Encounter (Signed)
Sent mychart msg informing pt of appointment made with Dr. Felecia Shelling

## 2022-11-27 ENCOUNTER — Other Ambulatory Visit (HOSPITAL_COMMUNITY): Payer: Self-pay

## 2022-11-27 ENCOUNTER — Telehealth: Payer: Self-pay

## 2022-11-27 NOTE — Telephone Encounter (Signed)
Pharmacy Patient Advocate Encounter   Received notification from [employer] that prior authorization for Fingolimod HCl 0.5MG  capsules is required/requested.   PA submitted to [facility] via CoverMyMeds Key or (Medicaid) confirmation # [id number] Status is pending  Waiting for clinical questions to populate

## 2022-12-02 ENCOUNTER — Encounter: Payer: Self-pay | Admitting: Neurology

## 2022-12-03 NOTE — Telephone Encounter (Signed)
Hi, [facility] is telling me that [employer] is requiring prior authorization for the [facility] generic.  Has [employer] contacted you regarding this?  I guess maybe it has to be completely yearly?  I know I have refills.  My number is [phone] if you need to call me. Thanks so much! -Shelly Money7-14-2024

## 2022-12-06 ENCOUNTER — Other Ambulatory Visit (HOSPITAL_COMMUNITY): Payer: Self-pay

## 2022-12-10 ENCOUNTER — Other Ambulatory Visit (HOSPITAL_COMMUNITY): Payer: Self-pay

## 2022-12-10 NOTE — Telephone Encounter (Signed)
Called insurance to follow up-[employer] states they do not handle the PA-transferred me and was able to start the PA over the phone-they are faxing the clinical questions form.  Call Reference: 12/10/2022-AlexaW.

## 2022-12-10 NOTE — Telephone Encounter (Signed)
Faxed form and clinicals to [employer] at [phone] determination. Requested expedited review.

## 2022-12-11 NOTE — Telephone Encounter (Signed)
Pharmacy Patient Advocate Encounter  Received notification from  [employer]  that Prior Authorization for Fingolimod has been APPROVED from 12/10/2022 to 12/10/2023.Shelly Kitchen  PA #/Case ID/Reference #: EOC ID: [id number]

## 2023-01-01 ENCOUNTER — Telehealth: Payer: Self-pay | Admitting: Neurology

## 2023-01-01 DIAGNOSIS — G35 Multiple sclerosis: Secondary | ICD-10-CM

## 2023-01-01 MED ORDER — FINGOLIMOD HCL 0.5 MG PO CAPS
1.0000 | ORAL_CAPSULE | Freq: Every day | ORAL | 3 refills | Status: DC
Start: 2023-01-01 — End: 2023-07-10

## 2023-01-01 NOTE — Telephone Encounter (Signed)
Called patient she states she was told a PA was needed I explained PA was done on July and approved. Pt asked that I res[facility]. Rx sent.   Follow up scheduled on 01/16/23

## 2023-01-01 NOTE — Telephone Encounter (Signed)
Pt calling re: the approval time of 12-10-22/12-10-23 for Fingolimod HCl 0.5 MG CAPS , she is out of medication and still waiting .  [facility] not received anything, please contact pt on this

## 2023-01-16 ENCOUNTER — Encounter: Payer: Self-pay | Admitting: Neurology

## 2023-01-16 ENCOUNTER — Telehealth: Payer: BC Managed Care – PPO | Admitting: Neurology

## 2023-01-16 DIAGNOSIS — R269 Unspecified abnormalities of gait and mobility: Secondary | ICD-10-CM

## 2023-01-16 DIAGNOSIS — G43009 Migraine without aura, not intractable, without status migrainosus: Secondary | ICD-10-CM

## 2023-01-16 DIAGNOSIS — Z170 Estrogen receptor positive status [ER+]: Secondary | ICD-10-CM

## 2023-01-16 DIAGNOSIS — Z79899 Other long term (current) drug therapy: Secondary | ICD-10-CM

## 2023-01-16 DIAGNOSIS — C50912 Malignant neoplasm of unspecified site of left female breast: Secondary | ICD-10-CM

## 2023-01-16 DIAGNOSIS — G35D Multiple sclerosis, unspecified: Secondary | ICD-10-CM

## 2023-01-16 DIAGNOSIS — G35 Multiple sclerosis: Secondary | ICD-10-CM | POA: Diagnosis not present

## 2023-01-16 NOTE — Progress Notes (Signed)
[facility]  PATIENT: Shelly Boyd DOB: 1960/06/17  REFERRING DOCTOR OR PCP:  Dr. Donnetta Simpers, PCP is Alain Honey SOURCE: patient, notes rom Dr. Hale Bogus, lab/imaging results, MRI images on CD/PACS  _________________________________   HISTORICAL  HISTORY OF PRESENT ILLNESS:  Shelly Boyd is a 62 y.o. woman who was diagnosed with relapsing remitting MS in 2012  Virtual Visit via Video Note I connected with Shelly Boyd  on 01/16/23 at  3:00 PM EDT by a video enabled telemedicine application and verified that I am speaking with the correct person.  I discussed the limitations of evaluation and management by telemedicine and the availability of in person appointments. The patient expressed understanding and agreed to proceed.  Patient was in her home.  Doctor was in the office.  Update 07/17/2022: She is on fingolimod and tolerates it well.   Her lymphocyte counts are sometimes 0.2-0.3 , 0.3 in last labs in Jan 2024 .   We discussed that low lymphocytes counts are common and related to mechanism but risk of infection may increase with age on fingolimod.  She is walking well .  Mild reduced balance is stable.  No recent falls.   She tries to walk some throughout the day.    She can walk 2 mile without stopping.  She has no trouble on stairs though will hold on the bannister.  Bleachers are difficult She denies muscle weakness or spasticity.   She denies numbness.   She sees urology now for urinary changes and had urodynamic testing and was noted to have good muscle pressure.   Has had a couple UTIs this year  Vision is fine. Eye exam earlier this year was good, including OCT    She notes some fatigue but no more than last year.   She tries to stay active and works 40 hours.     Cognition is doing well.   She denies depression now.   She sleeps well most nights.    She has anxiety, worse since cancer was diagnosed.  She was diagnosed with breast cancer and had surgery  earlier this year and had RadRx in 2021/2022.  Marland KitchenLymph nodes were clear.    She is on anastrozole - 1/2 way through the 5 years.    She will be doing a mammogram.  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   .    She has some migraines but they are better since starting CoQ10 and she cut the Topamax from 3 to 2 pills  . They are often triggered by changes in barometric pressure. Imitrex helps but seems to cause joint pain.    Generally they are rare - only a few the past year.     She has lost 40 pounds on [employer], doing with husband which has helped.      Physical Exam BP 121/86.  She is a well-developed well-nourished woman in no acute distress.  The head is normocephalic and atraumatic.  Sclera are anicteric.  Visible skin appears normal.  The neck has a good range of motion.  She is alert and fully oriented with fluent speech and good attention, knowledge and memory.  Extraocular muscles are intact.  Facial strength is normal.  She appears to have normal strength in the arms. Good shoulder ROM Rapid alternating movements and finger-nose-finger are performed well.   MS History:   She was diagnosed with MS in 2012 after presenting with pain and numbness in the  right arm and blurry vision. Her ophthalmologist diagnosed her with nystagmus. She was referred to Dr. Hale Bogus in advance and was diagnosed with MS after an MRI. Combination of her symptoms and classic MRI were sufficient to diagnose MS without the need to undergo a lumbar puncture. She received 3 days of IV Solu-Medrol and was back to baseline in 3 weeks..  Initially, she was placed on Betaseron. She had a rash with that and was switched to Tysabri. After 42 infusions, she switched due to a high JCV antibody titer (0.66).  Since February 2017 she has been on Gilenya. She tolerates it well and has not had any exacerbations.    Her last MRI of the brain was performed December 2017. Personally reviewed the images. She she has  multiple T2/FLAIR hyperintense foci, predominantly in the periventricular white matter. Most are radially oriented to the ventricles and some are hypointense on T1-weighted images. None of the foci enhanced. There were no acute findings.     Imaging: MRI brain 02/07/2022 showed Multiple T2/FLAIR hyperintense foci predominantly in the periventricular of the white matter. 1 small focus is also noted in the left pons. None of the foci enhance. They do not appear to be acute. Compared to the MRI from 11/25/2018, there are no new lesions.   MRI of the brain 11/25/2018 showed Multiple T2/FLAIR hyperintense foci predominantly in the periventricular white matter of the hemispheres in a pattern and configuration consistent with chronic demyelinating plaque associated with multiple sclerosis.  None of the foci enhance or appear to be acute.  Compared to the MRI dated 08/22/2017, there do not appear to be any new lesions.      There is a normal enhancement pattern and no acute findings  MRI of the brain 08/22/2017 showed Multiple T2/FLAIR hyperintense foci, predominantly in the periventricular white matter in a pattern and configuration consistent with chronic demyelinating plaque associated with multiple sclerosis.  None of the foci appears to be acute.  When compared to the MRI dated 04/26/2016, there is no interim change.  REVIEW OF SYSTEMS: Constitutional: No fevers, chills, sweats, or change in appetite.   She reports fatigue.   Has gained a few pounds over the past year.   Eyes: No visual changes, double vision, eye pain Ear, nose and throat: No hearing loss, ear pain, nasal congestion, sore throat Cardiovascular: No chest pain.  She has had palpitations Respiratory:  No shortness of breath at rest or with exertion.   No wheezes.   Snores, possible OSA. GastrointestinaI: No nausea, vomiting, diarrhea, abdominal pain, fecal incontinence Genitourinary:  No dysuria, urinary retention or frequency.  2 x   nocturia. Musculoskeletal:  No neck pain, back pain Integumentary: No rash, pruritus, skin lesions Neurological: as above Psychiatric: No depression at this time.  No anxiety Endocrine: She sees endocrinology for a goiter and subclinical hyperthyroidism (Dr. Griffith Citron).  No diaphoresis, change in appetite, change in weigh or increased thirst Hematologic/Lymphatic:  No anemia, purpura, petechiae. Allergic/Immunologic: No itchy/runny eyes, nasal congestion, recent allergic reactions, rashes  ALLERGIES: Allergies  Allergen Reactions   Benadryl [Diphenhydramine Hcl]     Pass out/seizure activity    Gabapentin     Swelling, SOB, back pain   Betaseron [Interferon Beta-1b] Rash    HOME MEDICATIONS:  Current Outpatient Medications:    anastrozole (ARIMIDEX) 1 MG tablet, Take 1 mg by mouth daily., Disp: , Rfl:    Ascorbic Acid (VITAMIN C) 1000 MG tablet, Take 1,000 mg by mouth daily. 2x per  week, Disp: , Rfl:    Coenzyme Q10 60 MG CAPS, Take by mouth., Disp: , Rfl:    famotidine (PEPCID) 20 MG tablet, Take 20 mg by mouth in the morning and at bedtime., Disp: , Rfl:    Fingolimod HCl 0.5 MG CAPS, Take 1 capsule (0.5 mg total) by mouth daily., Disp: 30 capsule, Rfl: 3   ibuprofen (ADVIL,MOTRIN) 200 MG tablet, Take by mouth. PRN, Disp: , Rfl:    imipramine (TOFRANIL) 25 MG tablet, Take 1 tablet (25 mg total) by mouth at bedtime., Disp: 90 tablet, Rfl: 3   Lactobacillus Rhamnosus, GG, (CULTURELLE PO), Take by mouth 2 (two) times daily., Disp: , Rfl:    lidocaine (LIDODERM) 5 %, APPLY ONE PATCH TO AFFECTED THIGH DAILY AS NEEDED. REMOVE AFTER 12-14 HOURS., Disp: 30 patch, Rfl: 5   Magnesium Gluconate 550 MG TABS, Take 400 mg by mouth., Disp: , Rfl:    methimazole (TAPAZOLE) 10 MG tablet, Take 10 mg by mouth daily., Disp: , Rfl:    SUMAtriptan (IMITREX) 50 MG tablet, Take one as needed for migraine.   May repeat in 2 hours if headache persists or recurs., Disp: 30 tablet, Rfl: 3   topiramate (TOPAMAX)  25 MG tablet, Take one tablet in the morning and 3 tablets at night, Disp: 360 tablet, Rfl: 1   Turmeric 500 MG TABS, Take 1,000 mg by mouth daily. , Disp: , Rfl:    VITAMIN D, ERGOCALCIFEROL, PO, Take 5,000 Units by mouth 3 (three) times a week., Disp: , Rfl:   PAST MEDICAL HISTORY: Past Medical History:  Diagnosis Date   Anxiety    Bell's palsy    Hypertension    Migraine    MS (multiple sclerosis) (HCC)    Seizures (HCC)    Thyroid disease    Vision abnormalities     PAST SURGICAL HISTORY: Past Surgical History:  Procedure Laterality Date   ENDOMETRIAL ABLATION     TUBAL LIGATION      FAMILY HISTORY: Family History  Problem Relation Age of Onset   Dementia Mother    Osteoarthritis Mother    Transient ischemic attack Mother    Hypertension Mother    Heart disease Father    Hypertension Father    Diabetes Mellitus II Father    Migraines Sister    Heart attack Brother    Colon cancer Brother    Diverticulitis Brother    Healthy Brother    Tremor Brother    Healthy Brother      ___________________________________  Multiple sclerosis (HCC) - Plan: MR BRAIN W WO CONTRAST  High risk medication use  Ductal carcinoma of breast, estrogen receptor positive, stage 2, left (HCC)  Migraine without aura and without status migrainosus, not intractable  Gait disturbance   1.   Continue fingolimod.  We will change to 0.5 mg 5 days a week (off days separated by 3-4 days) as lymphocytes are usually 0.2-0.3.  Check MRI to assess for subclinical breakthrough activity  consider a different DMT if occurring.  2.    Imipramine 25 mg nightly for her dysesthetic pain.  Topiramate for migraine -- ok for lower dose 3.   stay active and exercise as tolerated. 4.   Return in 6 months or sooner for new or worsening neurologic symptoms.   Follow Up Instructions: I discussed the assessment and treatment plan with the patient. The patient was provided an opportunity to ask questions  and all were answered. The patient agreed with the  plan and demonstrated an understanding of the instructions.    The patient was advised to call back or seek an in-person evaluation if the symptoms worsen or if the condition fails to improve as anticipated.  I provided 27 minutes of non-face-to-face time during this encounter.

## 2023-01-22 ENCOUNTER — Telehealth: Payer: Self-pay | Admitting: Neurology

## 2023-01-22 NOTE — Telephone Encounter (Signed)
Sent mychart msg informing pt of appt made with Dr. Epimenio Foot

## 2023-01-22 NOTE — Telephone Encounter (Signed)
Pt last received her fingolimod 01-02-2023.  Nothing further needed.

## 2023-03-27 ENCOUNTER — Ambulatory Visit (INDEPENDENT_AMBULATORY_CARE_PROVIDER_SITE_OTHER): Payer: BC Managed Care – PPO

## 2023-03-27 DIAGNOSIS — G35 Multiple sclerosis: Secondary | ICD-10-CM

## 2023-03-27 MED ORDER — GADOBENATE DIMEGLUMINE 529 MG/ML IV SOLN
20.0000 mL | Freq: Once | INTRAVENOUS | Status: AC | PRN
Start: 2023-03-27 — End: 2023-03-27
  Administered 2023-03-27: 20 mL via INTRAVENOUS

## 2023-06-19 ENCOUNTER — Encounter: Payer: Self-pay | Admitting: Neurology

## 2023-07-10 ENCOUNTER — Other Ambulatory Visit: Payer: Self-pay | Admitting: Neurology

## 2023-07-10 DIAGNOSIS — G35 Multiple sclerosis: Secondary | ICD-10-CM

## 2023-07-29 ENCOUNTER — Encounter: Payer: Self-pay | Admitting: Neurology

## 2023-07-30 ENCOUNTER — Ambulatory Visit: Payer: BC Managed Care – PPO | Admitting: Neurology

## 2023-08-20 ENCOUNTER — Ambulatory Visit: Admitting: Neurology

## 2023-08-20 ENCOUNTER — Encounter: Payer: Self-pay | Admitting: Neurology

## 2023-08-20 VITALS — BP 150/100 | HR 99 | Ht 64.0 in | Wt 243.0 lb

## 2023-08-20 DIAGNOSIS — G35 Multiple sclerosis: Secondary | ICD-10-CM

## 2023-08-20 DIAGNOSIS — G43009 Migraine without aura, not intractable, without status migrainosus: Secondary | ICD-10-CM | POA: Diagnosis not present

## 2023-08-20 DIAGNOSIS — Z79899 Other long term (current) drug therapy: Secondary | ICD-10-CM | POA: Diagnosis not present

## 2023-08-20 DIAGNOSIS — G35D Multiple sclerosis, unspecified: Secondary | ICD-10-CM

## 2023-08-20 DIAGNOSIS — Z170 Estrogen receptor positive status [ER+]: Secondary | ICD-10-CM

## 2023-08-20 DIAGNOSIS — R269 Unspecified abnormalities of gait and mobility: Secondary | ICD-10-CM

## 2023-08-20 DIAGNOSIS — I890 Lymphedema, not elsewhere classified: Secondary | ICD-10-CM

## 2023-08-20 DIAGNOSIS — C50912 Malignant neoplasm of unspecified site of left female breast: Secondary | ICD-10-CM

## 2023-08-20 DIAGNOSIS — R5383 Other fatigue: Secondary | ICD-10-CM

## 2023-08-20 MED ORDER — MODAFINIL 200 MG PO TABS
200.0000 mg | ORAL_TABLET | Freq: Every day | ORAL | 5 refills | Status: AC
Start: 2023-08-20 — End: ?

## 2023-08-20 MED ORDER — FINGOLIMOD HCL 0.5 MG PO CAPS: ORAL_CAPSULE | ORAL | 11 refills | Status: DC

## 2023-08-20 NOTE — Progress Notes (Signed)
 [facility]  PATIENT: Shelly Boyd DOB: 06/18/1960  REFERRING DOCTOR OR PCP:  Dr. Donnetta Simpers, PCP is Alain Honey SOURCE: patient, notes rom Dr. Hale Bogus, lab/imaging results, MRI images on CD/PACS  _________________________________   HISTORICAL  HISTORY OF PRESENT ILLNESS:  Shelly Boyd is a 63 y.o. woman who was diagnosed with relapsing remitting MS in 2012 Chief Complaint  Patient presents with   Follow-up    Pt in 10 with husband Pt here for MS f/u Pt states increased fatigue      Update 08/20/2023: She is on fingolimod and tolerates it well.  Dose was reduced to 5 time a week due to lymphocytes=0.2.     Her lymphocyte counts are  0.3 in last labs in Mar 2025 .   We discussed that low lymphocytes counts are common and related to mechanism but risk of infection may increase with age on fingolimod.   She has had multiple upper respiratory infections.    She is feeling more fatigued, much worse over the last year.  This has made work more difficult and she is exhausted by the end of the work day.     She is sleeping 6-7 hours most nights and just 1 x nocturia with rapid falling back asleep.   She has snoring and HST showing mild OSA.      Cognition is mildly off, sometimes having difficulty with names.  She ia a little more forgetful than lat year.   Depression is better on sertraline.    She has mild anxiety.   She is having more difficulty with her gait.   She needs to use tha bannister on stairs due to reduced balance.   Bleachers are difficult .   She denies muscle weakness or spasticity.   She denies numbness.   She sees urology now for urinary changes and had urodynamic testing and was noted to have good muscle pressure.    Vision is fine. Eye exam earlier this year was good, including OCT     She was diagnosed with breast cancer and had surgery earlier this year and had RadRx in 2021/2022.  Marland KitchenLymph nodes were clear.    She is on anastrozole - 1/2 way through  the 5 years.    She will be doing a mammogram.  She has lymphedema on the left and is doing therapy.   .    Migraines are doing better.  She takes TPM. . They are often triggered by changes in barometric pressure. Imitrex helps but seems to cause joint pain.    Generally they are rare - only a few the past year.     .      MS History:   She was diagnosed with MS in 2012 after presenting with pain and numbness in the right arm and blurry vision. Her ophthalmologist diagnosed her with nystagmus. She was referred to Dr. Hale Bogus in advance and was diagnosed with MS after an MRI. Combination of her symptoms and classic MRI were sufficient to diagnose MS without the need to undergo a lumbar puncture. She received 3 days of IV Solu-Medrol and was back to baseline in 3 weeks..  Initially, she was placed on Betaseron. She had a rash with that and was switched to Tysabri. After 42 infusions, she switched due to a high JCV antibody titer (0.66).  Since February 2017 she has been on Gilenya. She tolerates it well and has not had any exacerbations.    Her last MRI of  the brain was performed December 2017. Personally reviewed the images. She she has multiple T2/FLAIR hyperintense foci, predominantly in the periventricular white matter. Most are radially oriented to the ventricles and some are hypointense on T1-weighted images. None of the foci enhanced. There were no acute findings.     Imaging: MRI brain 02/07/2022 showed Multiple T2/FLAIR hyperintense foci predominantly in the periventricular of the white matter. 1 small focus is also noted in the left pons. None of the foci enhance. They do not appear to be acute. Compared to the MRI from 11/25/2018, there are no new lesions.   MRI of the brain 11/25/2018 showed Multiple T2/FLAIR hyperintense foci predominantly in the periventricular white matter of the hemispheres in a pattern and configuration consistent with chronic demyelinating plaque associated with multiple  sclerosis.  None of the foci enhance or appear to be acute.  Compared to the MRI dated 08/22/2017, there do not appear to be any new lesions.      There is a normal enhancement pattern and no acute findings  MRI of the brain 08/22/2017 showed Multiple T2/FLAIR hyperintense foci, predominantly in the periventricular white matter in a pattern and configuration consistent with chronic demyelinating plaque associated with multiple sclerosis.  None of the foci appears to be acute.  When compared to the MRI dated 04/26/2016, there is no interim change.  REVIEW OF SYSTEMS: Constitutional: No fevers, chills, sweats, or change in appetite.   She reports fatigue.   Has gained a few pounds over the past year.   Eyes: No visual changes, double vision, eye pain Ear, nose and throat: No hearing loss, ear pain, nasal congestion, sore throat Cardiovascular: No chest pain.  She has had palpitations Respiratory:  No shortness of breath at rest or with exertion.   No wheezes.   Snores, possible OSA. GastrointestinaI: No nausea, vomiting, diarrhea, abdominal pain, fecal incontinence Genitourinary:  No dysuria, urinary retention or frequency.  2 x  nocturia. Musculoskeletal:  No neck pain, back pain Integumentary: No rash, pruritus, skin lesions Neurological: as above Psychiatric: No depression at this time.  No anxiety Endocrine: She sees endocrinology for a goiter and subclinical hyperthyroidism (Dr. Griffith Citron).  No diaphoresis, change in appetite, change in weigh or increased thirst Hematologic/Lymphatic:  No anemia, purpura, petechiae. Allergic/Immunologic: No itchy/runny eyes, nasal congestion, recent allergic reactions, rashes  ALLERGIES: Allergies  Allergen Reactions   Benadryl [Diphenhydramine Hcl]     Pass out/seizure activity    Gabapentin     Swelling, SOB, back pain   Betaseron [Interferon Beta-1b] Rash    HOME MEDICATIONS:  Current Outpatient Medications:    anastrozole (ARIMIDEX) 1 MG tablet, Take  1 mg by mouth daily., Disp: , Rfl:    Ascorbic Acid (VITAMIN C) 1000 MG tablet, Take 1,000 mg by mouth daily. 2x per week, Disp: , Rfl:    benazepril (LOTENSIN) 10 MG tablet, Take 10 mg by mouth daily., Disp: , Rfl:    Coenzyme Q10 60 MG CAPS, Take by mouth., Disp: , Rfl:    famotidine (PEPCID) 20 MG tablet, Take 20 mg by mouth in the morning and at bedtime., Disp: , Rfl:    Fingolimod HCl 0.5 MG CAPS, TAKE 1 CAPSULE BY MOUTH 1 TIME A DAY, Disp: 30 capsule, Rfl: 4   ibuprofen (ADVIL,MOTRIN) 200 MG tablet, Take by mouth. PRN, Disp: , Rfl:    Lactobacillus Rhamnosus, GG, (CULTURELLE PO), Take by mouth 2 (two) times daily., Disp: , Rfl:    lidocaine (LIDODERM) 5 %, APPLY  ONE PATCH TO AFFECTED THIGH DAILY AS NEEDED. REMOVE AFTER 12-14 HOURS., Disp: 30 patch, Rfl: 5   Magnesium Gluconate 550 MG TABS, Take 400 mg by mouth., Disp: , Rfl:    methimazole (TAPAZOLE) 10 MG tablet, Take 10 mg by mouth daily., Disp: , Rfl:    modafinil (PROVIGIL) 200 MG tablet, Take 1 tablet (200 mg total) by mouth daily., Disp: 30 tablet, Rfl: 5   sertraline (ZOLOFT) 25 MG tablet, Take by mouth., Disp: , Rfl:    SUMAtriptan (IMITREX) 50 MG tablet, Take one as needed for migraine.   May repeat in 2 hours if headache persists or recurs., Disp: 30 tablet, Rfl: 3   topiramate (TOPAMAX) 25 MG tablet, Take one tablet in the morning and 3 tablets at night, Disp: 360 tablet, Rfl: 1   Turmeric 500 MG TABS, Take 1,000 mg by mouth daily. , Disp: , Rfl:    TURMERIC PO, Take by mouth., Disp: , Rfl:    VITAMIN D, ERGOCALCIFEROL, PO, Take 5,000 Units by mouth 3 (three) times a week., Disp: , Rfl:    imipramine (TOFRANIL) 25 MG tablet, Take 1 tablet (25 mg total) by mouth at bedtime. (Patient not taking: Reported on 08/20/2023), Disp: 90 tablet, Rfl: 3  PAST MEDICAL HISTORY: Past Medical History:  Diagnosis Date   Anxiety    Bell's palsy    Hypertension    Migraine    MS (multiple sclerosis) (HCC)    Seizures (HCC)    Thyroid disease     Vision abnormalities     PAST SURGICAL HISTORY: Past Surgical History:  Procedure Laterality Date   ENDOMETRIAL ABLATION     TUBAL LIGATION      FAMILY HISTORY: Family History  Problem Relation Age of Onset   Dementia Mother    Osteoarthritis Mother    Transient ischemic attack Mother    Hypertension Mother    Heart disease Father    Hypertension Father    Diabetes Mellitus II Father    Migraines Sister    Heart attack Brother    Colon cancer Brother    Diverticulitis Brother    Healthy Brother    Tremor Brother    Healthy Brother    Multiple sclerosis Neg Hx     SOCIAL HISTORY:  Social History   Socioeconomic History   Marital status: Married    Spouse name: Not on file   Number of children: Not on file   Years of education: Not on file   Highest education level: Not on file  Occupational History   Not on file  Tobacco Use   Smoking status: Never   Smokeless tobacco: Never  Substance and Sexual Activity   Alcohol use: No   Drug use: Not on file   Sexual activity: Not on file  Other Topics Concern   Not on file  Social History Narrative   Pt lives with husband    Pt works    Social Drivers of Corporate investment banker Strain: Low Risk  (07/30/2023)   Received from [facility]   Overall Financial Resource Strain (CARDIA)    Difficulty of Paying Living Expenses: Not hard at all  Food Insecurity: No Food Insecurity (07/30/2023)   [facility] from [facility]   Hunger Vital Sign    Worried About Running Out of Food in the Last Year: Never true    Ran Out of Food in the Last Year: Never true  Transportation Needs: No Transportation Needs (07/30/2023)  Received from [facility] - Transportation    Lack of Transportation (Medical): No    Lack of Transportation (Non-Medical): No  Physical Activity: Unknown (07/30/2023)   Received from [facility]   Exercise Vital Sign    Days of Exercise per Week: 0 days    Minutes of Exercise per  Session: Not on file  Stress: No Stress Concern Present (07/30/2023)   Received from [facility] - Occupational Stress Questionnaire    Feeling of Stress : Not at all  Social Connections: Socially Integrated (07/30/2023)   Received from [facility]   Social Network    How would you rate your social network (family, work, friends)?: Good participation with social networks  Intimate Partner Violence: Not At Risk (07/30/2023)   Received from [facility]   HITS    Over the last 12 months how often did your partner physically hurt you?: Never    Over the last 12 months how often did your partner insult you or talk down to you?: Never    Over the last 12 months how often did your partner threaten you with physical harm?: Never    Over the last 12 months how often did your partner scream or curse at you?: Never     PHYSICAL EXAM  Vitals:   08/20/23 1558  BP: (!) 150/100  Pulse: 99  SpO2: 100%  Weight: 243 lb (110.2 kg)  Height: 5\' 4"  (1.626 m)   Repeat BP 145/85  Body mass index is 41.71 kg/m.   General: The patient is well-developed and well-nourished and in no acute distress.   Neck is nontender with good ROM.  Left arm lymphedema   Neurologic Exam  Mental status: The patient is alert and oriented x 3 at the time of the examination. The patient has apparent normal recent and remote memory, with an apparently normal attention span and concentration ability.   Speech is normal.  Cranial nerves: Extraocular movements are full. Facial strength and sensation are normal.   Trapezius strength is normal.   No obvious hearing deficits are noted.  Motor:  Muscle bulk is normal.   Tone is normal in the arms but mildly increased in the right leg. .   Strength is  5 / 5 in all 4 extremities.   Sensory: She has normal touch sensation in arms ad leg now  Coordination: Finger-nose-finger and heel-to-shin is performed well.  Gait and station:  Station is normal.   The gait is mildly wide and tandem gait is wide.  Romberg is negative.  Reflexes: Deep tendon reflexes are symmetric and normal bilaterally.     ___________________________________  Multiple sclerosis (HCC)  High risk medication use  Ductal carcinoma of breast, estrogen receptor positive, stage 2, left (HCC)  Migraine without aura and without status migrainosus, not intractable  Gait disturbance  Lymphedema  Other fatigue   1.   Continue fingolimod.  Recent lab work was fine.  If she gets many more infections we will need to consider a different disease modifying therapy.   2.   Continue topiramate for migraines.  Try to lose more weight with diet.     Trial of modafinil.   3.   She has become more disabled.   Work has become difficult due to fatigue, frequent infections,worsening gait,  lymphedema from her breast cancer 3.   Return in 6 months or sooner for new or worsening neurologic symptoms.

## 2023-10-07 ENCOUNTER — Encounter: Payer: Self-pay | Admitting: Neurology

## 2023-10-08 MED ORDER — TOPIRAMATE 25 MG PO TABS: ORAL_TABLET | ORAL | 3 refills | Status: AC

## 2023-10-08 NOTE — Telephone Encounter (Signed)
 Last seen on 08/20/23 Follow up scheduled 03/26/24

## 2023-11-13 LAB — COLOGUARD: COLOGUARD: NEGATIVE

## 2023-12-11 ENCOUNTER — Telehealth: Payer: Self-pay

## 2023-12-11 NOTE — Telephone Encounter (Signed)
 Pharmacy Patient Advocate Encounter   Received notification from Physician's Office that prior authorization for Fingolimod  is required/requested.   Insurance verification completed.   The patient is insured through [employer] .   Per test claim: PA required; PA submitted to above mentioned insurance via Fax Key/confirmation #/EOC [id number] Status is pending

## 2023-12-13 ENCOUNTER — Other Ambulatory Visit (HOSPITAL_COMMUNITY): Payer: Self-pay

## 2023-12-13 NOTE — Telephone Encounter (Signed)
 Pharmacy Patient Advocate Encounter  Received notification from [employer] that Prior Authorization for Fingolimod  has been APPROVED from 12/12/2023 to 12/11/2024   PA #/Case ID/Reference #: [id number]

## 2023-12-26 ENCOUNTER — Encounter: Payer: Self-pay | Admitting: Neurology

## 2023-12-26 ENCOUNTER — Telehealth: Payer: Self-pay

## 2023-12-26 NOTE — Telephone Encounter (Signed)
 Sent pt's paperwork to her Mychart

## 2023-12-26 NOTE — Telephone Encounter (Signed)
 Printed off and filled out waiting for MD to sign

## 2023-12-26 NOTE — Telephone Encounter (Signed)
 Pt was unable to print her paperwork off. Pt requested her paperwork be mailed to her. Placed paperwork up front to be mailed out.

## 2024-01-27 ENCOUNTER — Other Ambulatory Visit: Payer: Self-pay | Admitting: Neurology

## 2024-01-28 ENCOUNTER — Encounter: Payer: Self-pay | Admitting: Neurology

## 2024-01-28 MED ORDER — LIDOCAINE 5 % EX PTCH: MEDICATED_PATCH | CUTANEOUS | 5 refills | Status: AC

## 2024-01-28 NOTE — Telephone Encounter (Signed)
 Dr.Sater are you okay with refilling the lidocaine  patches? It was last prescribed in 2023.  Last seen on 08/20/23 Follow up scheduled on 03/26/24

## 2024-01-28 NOTE — Telephone Encounter (Signed)
 Last seen on 08/20/23 Follow up scheduled on 03/26/24   Rx last prescribed in 2023. It doesn't appear that picked up Rx.  Rx denied

## 2024-03-19 ENCOUNTER — Encounter: Payer: Self-pay | Admitting: Neurology

## 2024-03-26 ENCOUNTER — Telehealth: Admitting: Neurology

## 2024-03-26 ENCOUNTER — Encounter: Payer: Self-pay | Admitting: Neurology

## 2024-03-26 DIAGNOSIS — C50912 Malignant neoplasm of unspecified site of left female breast: Secondary | ICD-10-CM | POA: Diagnosis not present

## 2024-03-26 DIAGNOSIS — R269 Unspecified abnormalities of gait and mobility: Secondary | ICD-10-CM | POA: Diagnosis not present

## 2024-03-26 DIAGNOSIS — Z170 Estrogen receptor positive status [ER+]: Secondary | ICD-10-CM

## 2024-03-26 DIAGNOSIS — Z79899 Other long term (current) drug therapy: Secondary | ICD-10-CM

## 2024-03-26 DIAGNOSIS — G35A Relapsing-remitting multiple sclerosis: Secondary | ICD-10-CM

## 2024-03-26 DIAGNOSIS — G35D Multiple sclerosis, unspecified: Secondary | ICD-10-CM

## 2024-03-26 MED ORDER — FINGOLIMOD HCL 0.5 MG PO CAPS: ORAL_CAPSULE | ORAL | 3 refills | Status: AC

## 2024-03-26 NOTE — Progress Notes (Signed)
 [facility]  PATIENT: Shelly Boyd DOB: July 07, 1960  REFERRING DOCTOR OR PCP:  Dr. Norleen Candy, PCP is Charlie Sic SOURCE: patient, notes rom Dr. Candy, lab/imaging results, MRI images on CD/PACS  _________________________________   HISTORICAL  HISTORY OF PRESENT ILLNESS:  Shelly Boyd is a 63 y.o. woman who was diagnosed with relapsing remitting MS in 2012  Virtual Visit via Video Note I connected with [name]  on 03/26/24 at  4:00 PM EST by a video enabled telemedicine application and verified that I am speaking with the correct person.  Due to connection issues the visit was completed by phone.  I discussed the limitations of evaluation and management by telemedicine and the availability of in person appointments. The patient expressed understanding and agreed to proceed.  Patient was in her home.  Doctor was in the office.  Update 03/26/2024: She is on fingolimod  5 times a week and tolerates it well.   Her lymphocyte counts was 0.2-0.3 prompting the reduction  She is walking well .  She notes mild reduced balance is stable.  She will use the bannister on stairs,  No recent falls.   She tries to walk some throughout the day.    She can walk > 1 mile without stopping.  She has no trouble on stairs though will hold on the bannister.  Bleachers are difficult She denies muscle weakness or spasticity.   She denies numbness.   She sees urology now for urinary changes and had urodynamic testing and was noted to have good muscle pressure.   She has a recent UTI and is on macrobid.  She has had a couple UTIs this year  Vision is fine. Eye exam earlier this year was good, including OCT    She notes fatigue.  She never took the modafinil  though filled it once,  .   She tries to stay active and works 40 hours.     Cognition is doing well.   She denies depression now.   She sleeps well most nights but some nights wakes up in midlle of the night and has trouble falling  asleep.    She has anxiety.  She was diagnosed with breast cancer and had surgery and RadRx in 2021/2022.  SABRALymph nodes were clear.    She is on anastrozole.  Recent mammogram was clear.  She still has one more year of anastrazole to go.  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   .    She has rare migraines (only one in last 6 months)She is on TPM (100 mg po qHS. They are often triggered by changes in barometric pressure. Imitrex  helps but seems to cause joint pain.    Generally they are rare - only a few the past year.     She lost 40 pounds on [employer], but stable over last year.     MS History:   She was diagnosed with MS in 2012 after presenting with pain and numbness in the right arm and blurry vision. Her ophthalmologist diagnosed her with nystagmus. She was referred to Dr. Candy in advance and was diagnosed with MS after an MRI. Combination of her symptoms and classic MRI were sufficient to diagnose MS without the need to undergo a lumbar puncture. She received 3 days of IV Solu-Medrol and was back to baseline in 3 weeks..  Initially, she was placed on Betaseron. She had a rash with that and was switched to Tysabri. After  42 infusions, she switched due to a high JCV antibody titer (0.66).  Since February 2017 she has been on Gilenya . She tolerates it well and has not had any exacerbations.    Her last MRI of the brain was performed December 2017. Personally reviewed the images. She she has multiple T2/FLAIR hyperintense foci, predominantly in the periventricular white matter. Most are radially oriented to the ventricles and some are hypointense on T1-weighted images. None of the foci enhanced. There were no acute findings.     Imaging: MRI brain 03/27/2023 showed Multiple T2/FLAIR hyperintense foci in the cerebral hemispheres and a possible focus in the pons in a pattern consistent with chronic demyelinating plaque associated with multiple sclerosis. None of the foci  enhanced or appear to be acute. Compared to the MRI from 02/07/2022, there were no new lesions.   MRI brain 02/07/2022 showed Multiple T2/FLAIR hyperintense foci predominantly in the periventricular of the white matter. 1 small focus is also noted in the left pons. None of the foci enhance. They do not appear to be acute. Compared to the MRI from 11/25/2018, there are no new lesions.   MRI of the brain 11/25/2018 showed Multiple T2/FLAIR hyperintense foci predominantly in the periventricular white matter of the hemispheres in a pattern and configuration consistent with chronic demyelinating plaque associated with multiple sclerosis.  None of the foci enhance or appear to be acute.  Compared to the MRI dated 08/22/2017, there do not appear to be any new lesions.      There is a normal enhancement pattern and no acute findings  MRI of the brain 08/22/2017 showed Multiple T2/FLAIR hyperintense foci, predominantly in the periventricular white matter in a pattern and configuration consistent with chronic demyelinating plaque associated with multiple sclerosis.  None of the foci appears to be acute.  When compared to the MRI dated 04/26/2016, there is no interim change.  REVIEW OF SYSTEMS: Constitutional: No fevers, chills, sweats, or change in appetite.   She reports fatigue.   Has gained a few pounds over the past year.   Eyes: No visual changes, double vision, eye pain Ear, nose and throat: No hearing loss, ear pain, nasal congestion, sore throat Cardiovascular: No chest pain.  She has had palpitations Respiratory:  No shortness of breath at rest or with exertion.   No wheezes.   Snores, possible OSA. GastrointestinaI: No nausea, vomiting, diarrhea, abdominal pain, fecal incontinence Genitourinary:  No dysuria, urinary retention or frequency.  2 x  nocturia. Musculoskeletal:  No neck pain, back pain Integumentary: No rash, pruritus, skin lesions Neurological: as above Psychiatric: No depression at this time.   No anxiety Endocrine: She sees endocrinology for a goiter and subclinical hyperthyroidism (Dr. Cindee).  No diaphoresis, change in appetite, change in weigh or increased thirst Hematologic/Lymphatic:  No anemia, purpura, petechiae. Allergic/Immunologic: No itchy/runny eyes, nasal congestion, recent allergic reactions, rashes  ALLERGIES: Allergies  Allergen Reactions   Benadryl [Diphenhydramine Hcl]     Pass out/seizure activity    Gabapentin      Swelling, SOB, back pain   Betaseron [Interferon Beta-1b] Rash    HOME MEDICATIONS:  Current Outpatient Medications:    anastrozole (ARIMIDEX) 1 MG tablet, Take 1 mg by mouth daily., Disp: , Rfl:    Ascorbic Acid (VITAMIN C) 1000 MG tablet, Take 1,000 mg by mouth daily. 2x per week, Disp: , Rfl:    benazepril (LOTENSIN) 10 MG tablet, Take 10 mg by mouth daily., Disp: , Rfl:    Coenzyme Q10 60  MG CAPS, Take by mouth., Disp: , Rfl:    famotidine (PEPCID) 20 MG tablet, Take 20 mg by mouth in the morning and at bedtime., Disp: , Rfl:    Fingolimod  HCl 0.5 MG CAPS, One po qd, Disp: 90 capsule, Rfl: 3   ibuprofen (ADVIL,MOTRIN) 200 MG tablet, Take by mouth. PRN, Disp: , Rfl:    Lactobacillus Rhamnosus, GG, (CULTURELLE PO), Take by mouth 2 (two) times daily., Disp: , Rfl:    lidocaine  (LIDODERM ) 5 %, Apply one patch to affected thigh daily as needed. Remove after 12-14 hours., Disp: 30 patch, Rfl: 5   Magnesium Gluconate 550 MG TABS, Take 400 mg by mouth., Disp: , Rfl:    methimazole (TAPAZOLE) 10 MG tablet, Take 10 mg by mouth daily., Disp: , Rfl:    modafinil  (PROVIGIL ) 200 MG tablet, Take 1 tablet (200 mg total) by mouth daily., Disp: 30 tablet, Rfl: 5   sertraline (ZOLOFT) 25 MG tablet, Take by mouth., Disp: , Rfl:    SUMAtriptan  (IMITREX ) 50 MG tablet, Take one as needed for migraine.   May repeat in 2 hours if headache persists or recurs., Disp: 30 tablet, Rfl: 3   topiramate  (TOPAMAX ) 25 MG tablet, Take 1 tablet (25 mg total) by mouth every  morning AND 2 tablets (50 mg total) every evening., Disp: 270 tablet, Rfl: 3   Turmeric 500 MG TABS, Take 1,000 mg by mouth daily. , Disp: , Rfl:    TURMERIC PO, Take by mouth., Disp: , Rfl:    VITAMIN D , ERGOCALCIFEROL , PO, Take 5,000 Units by mouth 3 (three) times a week., Disp: , Rfl:   PAST MEDICAL HISTORY: Past Medical History:  Diagnosis Date   Anxiety    Bell's palsy    Hypertension    Migraine    MS (multiple sclerosis)    Seizures (HCC)    Thyroid disease    Vision abnormalities     PAST SURGICAL HISTORY: Past Surgical History:  Procedure Laterality Date   ENDOMETRIAL ABLATION     TUBAL LIGATION      FAMILY HISTORY: Family History  Problem Relation Age of Onset   Dementia Mother    Osteoarthritis Mother    Transient ischemic attack Mother    Hypertension Mother    Heart disease Father    Hypertension Father    Diabetes Mellitus II Father    Migraines Sister    Heart attack Brother    Colon cancer Brother    Diverticulitis Brother    Healthy Brother    Tremor Brother    Healthy Brother    Multiple sclerosis Neg Hx      ___________________________________  Multiple sclerosis, relapsing-remitting - Plan: CBC with Differential/Platelet, CBC with Differential/Platelet  High risk medication use - Plan: CBC with Differential/Platelet, CBC with Differential/Platelet  Gait disturbance  Ductal carcinoma of breast, estrogen receptor positive, stage 2, left (HCC)  Multiple sclerosis - Plan: Fingolimod  HCl 0.5 MG CAPS   1.   Continue fingolimod  5 days a week (off days separated by 3-4 days)  Check CBC/D.   2.   Topiramate  for migraine, renew when needed.   Trial of modafinil  3.   stay active and exercise as tolerated. 4.   Return in 6 months or sooner for new or worsening neurologic symptoms.   Follow Up Instructions: I discussed the assessment and treatment plan with the patient. The patient was provided an opportunity to ask questions and all were  answered. The patient agreed with the plan  and demonstrated an understanding of the instructions.    The patient was advised to call back or seek an in-person evaluation if the symptoms worsen or if the condition fails to improve as anticipated.  I provided 18 minutes of non-face-to-face time during this encounter.

## 2024-03-28 ENCOUNTER — Ambulatory Visit: Payer: Self-pay | Admitting: Neurology

## 2024-03-28 LAB — CBC WITH DIFFERENTIAL/PLATELET
Basophils Absolute: 0 x10E3/uL (ref 0.0–0.2)
Basos: 1 %
EOS (ABSOLUTE): 0.1 x10E3/uL (ref 0.0–0.4)
Eos: 1 %
Hematocrit: 42.9 % (ref 34.0–46.6)
Hemoglobin: 13.8 g/dL (ref 11.1–15.9)
Immature Grans (Abs): 0 x10E3/uL (ref 0.0–0.1)
Immature Granulocytes: 0 %
Lymphocytes Absolute: 0.6 x10E3/uL — ABNORMAL LOW (ref 0.7–3.1)
Lymphs: 12 %
MCH: 29.8 pg (ref 26.6–33.0)
MCHC: 32.2 g/dL (ref 31.5–35.7)
MCV: 93 fL (ref 79–97)
Monocytes Absolute: 1 x10E3/uL — ABNORMAL HIGH (ref 0.1–0.9)
Monocytes: 20 %
Neutrophils Absolute: 3.5 x10E3/uL (ref 1.4–7.0)
Neutrophils: 66 %
Platelets: 259 x10E3/uL (ref 150–450)
RBC: 4.63 x10E6/uL (ref 3.77–5.28)
RDW: 12.8 % (ref 11.7–15.4)
WBC: 5.3 x10E3/uL (ref 3.4–10.8)

## 2024-10-28 ENCOUNTER — Ambulatory Visit: Admitting: Neurology
